# Patient Record
Sex: Female | Born: 1965
Health system: Southern US, Community
[De-identification: ages and names within clinical notes are randomized; demographics above are authoritative.]

## PROBLEM LIST (undated history)

## (undated) DIAGNOSIS — I10 Essential (primary) hypertension: Secondary | ICD-10-CM

## (undated) DIAGNOSIS — E785 Hyperlipidemia, unspecified: Secondary | ICD-10-CM

## (undated) DIAGNOSIS — I2510 Atherosclerotic heart disease of native coronary artery without angina pectoris: Secondary | ICD-10-CM

## (undated) HISTORY — DX: Hyperlipidemia, unspecified: E78.5

## (undated) HISTORY — DX: Essential (primary) hypertension: I10

## (undated) HISTORY — PX: HYSTEROSCOPY: SHX211

## (undated) HISTORY — PX: TUBAL LIGATION: SHX77

## (undated) HISTORY — DX: Atherosclerotic heart disease of native coronary artery without angina pectoris: I25.10

## (undated) HISTORY — PX: PELVIC LAPAROSCOPY: SHX162

## (undated) HISTORY — PX: ELBOW SURGERY: SHX618

---

## 1998-08-09 ENCOUNTER — Other Ambulatory Visit: Admission: RE | Admit: 1998-08-09 | Discharge: 1998-08-09 | Payer: Self-pay | Admitting: Gynecology

## 1999-08-14 ENCOUNTER — Other Ambulatory Visit: Admission: RE | Admit: 1999-08-14 | Discharge: 1999-08-14 | Payer: Self-pay | Admitting: Gynecology

## 2000-09-03 ENCOUNTER — Other Ambulatory Visit: Admission: RE | Admit: 2000-09-03 | Discharge: 2000-09-03 | Payer: Self-pay | Admitting: Gynecology

## 2000-12-01 ENCOUNTER — Other Ambulatory Visit: Admission: RE | Admit: 2000-12-01 | Discharge: 2000-12-01 | Payer: Self-pay | Admitting: Gynecology

## 2002-01-27 ENCOUNTER — Other Ambulatory Visit: Admission: RE | Admit: 2002-01-27 | Discharge: 2002-01-27 | Payer: Self-pay | Admitting: Gynecology

## 2003-05-16 ENCOUNTER — Other Ambulatory Visit: Admission: RE | Admit: 2003-05-16 | Discharge: 2003-05-16 | Payer: Self-pay | Admitting: Gynecology

## 2004-09-20 ENCOUNTER — Other Ambulatory Visit: Admission: RE | Admit: 2004-09-20 | Discharge: 2004-09-20 | Payer: Self-pay | Admitting: Gynecology

## 2006-03-24 ENCOUNTER — Other Ambulatory Visit: Admission: RE | Admit: 2006-03-24 | Discharge: 2006-03-24 | Payer: Self-pay | Admitting: Gynecology

## 2007-05-05 ENCOUNTER — Other Ambulatory Visit: Admission: RE | Admit: 2007-05-05 | Discharge: 2007-05-05 | Payer: Self-pay | Admitting: Gynecology

## 2008-05-05 ENCOUNTER — Other Ambulatory Visit: Admission: RE | Admit: 2008-05-05 | Discharge: 2008-05-05 | Payer: Self-pay | Admitting: Gynecology

## 2008-10-19 ENCOUNTER — Emergency Department (HOSPITAL_BASED_OUTPATIENT_CLINIC_OR_DEPARTMENT_OTHER): Admission: EM | Admit: 2008-10-19 | Discharge: 2008-10-20 | Payer: Self-pay | Admitting: Emergency Medicine

## 2008-12-07 ENCOUNTER — Ambulatory Visit: Payer: Self-pay | Admitting: Gynecology

## 2008-12-14 ENCOUNTER — Ambulatory Visit: Payer: Self-pay | Admitting: Gynecology

## 2009-06-06 ENCOUNTER — Ambulatory Visit: Payer: Self-pay | Admitting: Gynecology

## 2009-09-20 ENCOUNTER — Ambulatory Visit: Payer: Self-pay | Admitting: Gynecology

## 2010-04-10 ENCOUNTER — Other Ambulatory Visit: Admission: RE | Admit: 2010-04-10 | Discharge: 2010-04-10 | Payer: Self-pay | Admitting: Gynecology

## 2010-04-10 ENCOUNTER — Ambulatory Visit: Payer: Self-pay | Admitting: Gynecology

## 2011-01-13 LAB — CBC
MCHC: 33.6 g/dL (ref 30.0–36.0)
MCV: 85.9 fL (ref 78.0–100.0)
Platelets: 244 10*3/uL (ref 150–400)
WBC: 11 10*3/uL — ABNORMAL HIGH (ref 4.0–10.5)

## 2011-01-13 LAB — URINALYSIS, ROUTINE W REFLEX MICROSCOPIC
Glucose, UA: NEGATIVE mg/dL
Ketones, ur: >80 mg/dL — AB
Protein, ur: NEGATIVE mg/dL
Urobilinogen, UA: 0.2 mg/dL (ref 0.0–1.0)

## 2011-01-13 LAB — BASIC METABOLIC PANEL
BUN: 13 mg/dL (ref 6–23)
CO2: 26 mEq/L (ref 19–32)
Calcium: 9.3 mg/dL (ref 8.4–10.5)
Chloride: 102 mEq/L (ref 96–112)
Creatinine, Ser: 0.7 mg/dL (ref 0.4–1.2)
GFR calc Af Amer: >60 mL/min (ref >60)

## 2011-01-13 LAB — DIFFERENTIAL
Basophils Relative: 1 % (ref 0–1)
Eosinophils Absolute: 0 10*3/uL (ref 0.0–0.7)
Neutro Abs: 10.5 10*3/uL — ABNORMAL HIGH (ref 1.7–7.7)
Neutrophils Relative %: 96 % — ABNORMAL HIGH (ref 43–77)

## 2011-05-20 ENCOUNTER — Encounter: Payer: Self-pay | Admitting: Gynecology

## 2011-05-22 ENCOUNTER — Other Ambulatory Visit (HOSPITAL_COMMUNITY)
Admission: RE | Admit: 2011-05-22 | Discharge: 2011-05-22 | Disposition: A | Discharge status: Home / Self Care | Payer: BC Managed Care – PPO | Source: Ambulatory Visit | Attending: Gynecology | Admitting: Gynecology

## 2011-05-22 ENCOUNTER — Ambulatory Visit (INDEPENDENT_AMBULATORY_CARE_PROVIDER_SITE_OTHER): Payer: BC Managed Care – PPO | Admitting: Gynecology

## 2011-05-22 ENCOUNTER — Inpatient Hospital Stay (HOSPITAL_COMMUNITY): Admission: RE | Admit: 2011-05-22 | Payer: Self-pay | Source: Ambulatory Visit

## 2011-05-22 ENCOUNTER — Encounter: Payer: Self-pay | Admitting: Gynecology

## 2011-05-22 VITALS — BP 134/84 | Ht 60.5 in | Wt 127.0 lb

## 2011-05-22 DIAGNOSIS — R823 Hemoglobinuria: Secondary | ICD-10-CM

## 2011-05-22 DIAGNOSIS — G43909 Migraine, unspecified, not intractable, without status migrainosus: Secondary | ICD-10-CM | POA: Insufficient documentation

## 2011-05-22 DIAGNOSIS — Z13220 Encounter for screening for lipoid disorders: Secondary | ICD-10-CM

## 2011-05-22 DIAGNOSIS — R51 Headache: Secondary | ICD-10-CM

## 2011-05-22 DIAGNOSIS — G8929 Other chronic pain: Secondary | ICD-10-CM

## 2011-05-22 DIAGNOSIS — Z01419 Encounter for gynecological examination (general) (routine) without abnormal findings: Secondary | ICD-10-CM | POA: Insufficient documentation

## 2011-05-22 DIAGNOSIS — Z131 Encounter for screening for diabetes mellitus: Secondary | ICD-10-CM

## 2011-05-22 MED ORDER — NARATRIPTAN HCL 2.5 MG PO TABS: 2.5000 mg | ORAL_TABLET | ORAL | Status: DC | PRN

## 2011-05-22 NOTE — Progress Notes (Signed)
Theresa Bass 03-17-1966 [id number]        45 y.o.  for annual exam.  Doing well no complaints, tubal sterilization birth control.  Past medical history,surgical history, allergies, family history and social history were all reviewed and documented in the EPIC chart. ROS:  Was performed and pertinent positives and negatives are included in the history.  Exam: chaperone present Filed Vitals:   05/22/11 1029  BP: 134/84   General appearance  Normal Skin grossly normal Head/Neck normal with no cervical or supraclavicular adenopathy thyroid normal Lungs  clear Cardiac RR, without RMG Abdominal  soft, nontender, without masses, organomegaly or hernia Breasts  examined lying and sitting without masses, retractions, discharge or axillary adenopathy.  Bilateral nipple piercing noted Pelvic  Ext/BUS/vagina  normal   Cervix  normal  Pap done light menses flow noted  Uterus  anteverted, normal size, shape and contour, midline and mobile nontender   Adnexa  Without masses or tenderness    Anus and perineum  normal   Rectovaginal  normal sphincter tone without palpated masses or tenderness.    Assessment/Plan:  45 y.o. female for annual exam.   Doing well. Had nipple piercing this past year. Self breast exams on a monthly basis discussed and urge. Still has not had mammogram. I strongly encouraged her to schedule mammogram and she understands my recommendations.  Menses are regular. Sterilization birth control. Baseline CBC, urinalysis, glucose and lipid profile ordered. If significant he is well from a gynecologic standpoint she'll see me in a year. She does have a history of classic migraines and uses Amerge and I refilled her #10 with 2 refills.    Dara Lords MD, 11:25 AM 05/22/2011

## 2011-05-29 ENCOUNTER — Telehealth: Payer: Self-pay | Admitting: Gynecology

## 2011-05-29 NOTE — Telephone Encounter (Signed)
Tell patient low grade atypia on her Pap smear.  Not enough sample to do HPV testing as recommended and I like her to come back at her convenience to repeat her Pap smear.

## 2011-05-30 NOTE — Telephone Encounter (Signed)
Pt informed with the below note and will make appointment to schedule repeat pap.

## 2011-10-27 ENCOUNTER — Other Ambulatory Visit: Payer: Self-pay | Admitting: *Deleted

## 2011-10-27 DIAGNOSIS — G8929 Other chronic pain: Secondary | ICD-10-CM

## 2011-10-27 MED ORDER — NARATRIPTAN HCL 2.5 MG PO TABS: 2.5000 mg | ORAL_TABLET | ORAL | Status: DC | PRN

## 2011-10-28 NOTE — Telephone Encounter (Signed)
rx called in

## 2012-03-01 ENCOUNTER — Other Ambulatory Visit: Payer: Self-pay | Admitting: *Deleted

## 2012-03-01 DIAGNOSIS — G8929 Other chronic pain: Secondary | ICD-10-CM

## 2012-03-01 MED ORDER — NARATRIPTAN HCL 2.5 MG PO TABS: 2.5000 mg | ORAL_TABLET | ORAL | Status: DC | PRN

## 2012-03-01 NOTE — Telephone Encounter (Signed)
rx called in

## 2012-05-26 ENCOUNTER — Ambulatory Visit (INDEPENDENT_AMBULATORY_CARE_PROVIDER_SITE_OTHER): Payer: BC Managed Care – PPO | Admitting: Gynecology

## 2012-05-26 ENCOUNTER — Other Ambulatory Visit (HOSPITAL_COMMUNITY)
Admission: RE | Admit: 2012-05-26 | Discharge: 2012-05-26 | Disposition: A | Discharge status: Home / Self Care | Payer: BC Managed Care – PPO | Source: Ambulatory Visit | Attending: Gynecology | Admitting: Gynecology

## 2012-05-26 ENCOUNTER — Encounter: Payer: Self-pay | Admitting: Gynecology

## 2012-05-26 VITALS — BP 106/60 | Ht 61.0 in | Wt 125.0 lb

## 2012-05-26 DIAGNOSIS — Z01419 Encounter for gynecological examination (general) (routine) without abnormal findings: Secondary | ICD-10-CM

## 2012-05-26 DIAGNOSIS — R51 Headache: Secondary | ICD-10-CM

## 2012-05-26 DIAGNOSIS — G8929 Other chronic pain: Secondary | ICD-10-CM

## 2012-05-26 DIAGNOSIS — N926 Irregular menstruation, unspecified: Secondary | ICD-10-CM

## 2012-05-26 DIAGNOSIS — N951 Menopausal and female climacteric states: Secondary | ICD-10-CM

## 2012-05-26 LAB — CBC WITH DIFFERENTIAL/PLATELET
Hemoglobin: 13.9 g/dL (ref 12.0–15.0)
Lymphocytes Relative: 32 % (ref 12–46)
Lymphs Abs: 1.2 10*3/uL (ref 0.7–4.0)
Neutro Abs: 2.1 10*3/uL (ref 1.7–7.7)
Neutrophils Relative %: 55 % (ref 43–77)
Platelets: 255 10*3/uL (ref 150–400)
RBC: 4.69 MIL/uL (ref 3.87–5.11)
WBC: 3.7 10*3/uL — ABNORMAL LOW (ref 4.0–10.5)

## 2012-05-26 MED ORDER — NARATRIPTAN HCL 2.5 MG PO TABS: 2.5000 mg | ORAL_TABLET | ORAL | Status: DC | PRN

## 2012-05-26 NOTE — Patient Instructions (Signed)
Call to Schedule your mammogram  Facilities in [place]: 1)  [facility], [address] Phone: [phone] 2)  [facility], [address] Phone: [phone] 3)  Dr. Bertrand at [facility]  [address] Phone: [phone]     Mammogram A mammogram is an X-ray test to find changes in a woman's breast. You should get a mammogram if:  You are 40 years of age or older  You have risk factors.   Your doctor recommends that you have one.  BEFORE THE TEST  Do not schedule the test the week before your period, especially if your breasts are sore during this time.  On the day of your mammogram:  Wash your breasts and armpits well. After washing, do not put on any deodorant or talcum powder on until after your test.   Eat and drink as you usually do.   Take your medicines as usual.   If you are diabetic and take insulin, make sure you:   Eat before coming for your test.   Take your insulin as usual.   If you cannot keep your appointment, call before the appointment to cancel. Schedule another appointment.  TEST  You will need to undress from the waist up. You will put on a hospital gown.   Your breast will be put on the mammogram machine, and it will press firmly on your breast with a piece of plastic called a compression paddle. This will make your breast flatter so that the machine can X-ray all parts of your breast.   Both breasts will be X-rayed. Each breast will be X-rayed from above and from the side. An X-ray might need to be taken again if the picture is not good enough.   The mammogram will last about 15 to 30 minutes.  AFTER THE TEST Finding out the results of your test Ask when your test results will be ready. Make sure you get your test results.  Document Released: 12/12/2008 Document Revised: 09/04/2011 Document Reviewed: 12/12/2008 [employer] Patient  Information 2012 [employer].  

## 2012-05-26 NOTE — Progress Notes (Signed)
Theresa Bass [birth year] [id number]        46 y.o.  G5P0014 for annual exam.  Doing well. Several issues noted below  Past medical history,surgical history, medications, allergies, family history and social history were all reviewed and documented in the EPIC chart. ROS:  Was performed and pertinent positives and negatives are included in the history.  Exam: [name] assistant Filed Vitals:   05/26/12 0937  BP: 106/60  Height: 5\' 1"  (1.549 m)  Weight: 125 lb (56.7 kg)   General appearance  Normal Skin grossly normal Head/Neck normal with no cervical or supraclavicular adenopathy thyroid normal Lungs  clear Cardiac RR, without RMG Abdominal  soft, nontender, without masses, organomegaly or hernia Breasts  examined lying and sitting without masses, retractions, discharge or axillary adenopathy.  Bilateral nipple piercing. Pelvic  Ext/BUS/vagina  normal   Cervix  normal Pap/HPV  Uterus  anteverted, normal size, shape and contour, midline and mobile nontender   Adnexa  Without masses or tenderness    Anus and perineum  normal   Rectovaginal  normal sphincter tone without palpated masses or tenderness.    Assessment/Plan:  46 y.o. [id number] female for annual exam, status post BTL.   1. Irregular menses/menopausal symptoms. Patient's periods are getting more irregular this past year which she will skip one or 2 months. No prolonged or intermenstrual bleeding. Also having hot flushes and night sweats with some insomnia although this seems to be improving. It was worse during the summer. No other symptoms such as hair skin weight changes. Check FSH TSH.  Options for management include observation, low-dose HRT, low-dose oral contraceptives reviewed. The issues of her irregular menses also discussed. With her history of migraines I am reluctant with the oral contraceptives. Possible low-dose patch alone with intermittent progesterone withdrawal symptoms more than 2 months without menses discussed.  Patient does not want intervention but prefers observation at this time. OTC soy-based options reviewed.  If FSH elevated and will keep menstrual calendar as long as less frequent but normal menses will follow. Prolonged her typical bleeding will call or she does more than one year without bleeding and then bleeds. If FSH normal then recommend progesterone withdrawal if she goes more than 2 months without menses and she knows to call. 2. Migraine headaches. Patient's well-controlled on Amerge and I refilled her #10 with 5 refills. 3. Mammography. Patient's way overdue and knows to schedule this and agrees to do so. SBE monthly reviewed. 4. Pap smear. Patient has history of ASCUS last year. Insufficient for HPV. Was to follow up for repeat but never did. Pap/HPV done this year. No history of abnormal Paps previously with numerous normal reports in her chart. We'll triage based upon results. 5. Health maintenance. CBC glucose lipid profile were normal last year.  Cholesterol 205, HDL 74 LDL 107. We'll not repeat this year. Follow up one year, sooner as needed.    Dara Lords MD, 10:11 AM 05/26/2012

## 2012-05-27 ENCOUNTER — Encounter: Payer: Self-pay | Admitting: Gynecology

## 2012-05-27 LAB — URINALYSIS W MICROSCOPIC + REFLEX CULTURE
Bacteria, UA: NONE SEEN
Casts: NONE SEEN
Crystals: NONE SEEN
Glucose, UA: NEGATIVE mg/dL
Hgb urine dipstick: NEGATIVE
Ketones, ur: NEGATIVE mg/dL
Specific Gravity, Urine: 1.01 (ref 1.005–1.030)
pH: 7 (ref 5.0–8.0)

## 2012-08-13 ENCOUNTER — Telehealth: Payer: Self-pay | Admitting: *Deleted

## 2012-08-13 MED ORDER — MEDROXYPROGESTERONE ACETATE 10 MG PO TABS: 10.0000 mg | ORAL_TABLET | Freq: Two times a day (BID) | ORAL | Status: DC

## 2012-08-13 NOTE — Telephone Encounter (Signed)
For the bleeding right now I would take Provera 10 mg twice daily for 5 days.  I would then recommend office visit to discuss longer-term game plan.

## 2012-08-13 NOTE — Telephone Encounter (Signed)
Pt calling to follow up with irregular bleeding, pt said Oct and Nov she would bleed and pass big clots follow with cramping, heavy bleeding. Pt said this month has been the worse change pad every hour with extreme cramps. Last bleeding cycle was this pass Sunday and still bleeding now. Please advise

## 2012-08-13 NOTE — Telephone Encounter (Signed)
Pt informed with the below note. She will make appointment to discuss long term care.

## 2012-08-24 ENCOUNTER — Encounter: Payer: Self-pay | Admitting: Gynecology

## 2012-08-24 ENCOUNTER — Ambulatory Visit (INDEPENDENT_AMBULATORY_CARE_PROVIDER_SITE_OTHER): Payer: BC Managed Care – PPO | Admitting: Gynecology

## 2012-08-24 DIAGNOSIS — N920 Excessive and frequent menstruation with regular cycle: Secondary | ICD-10-CM

## 2012-08-24 DIAGNOSIS — N926 Irregular menstruation, unspecified: Secondary | ICD-10-CM

## 2012-08-24 DIAGNOSIS — N951 Menopausal and female climacteric states: Secondary | ICD-10-CM

## 2012-08-24 MED ORDER — MEGESTROL ACETATE 20 MG PO TABS: 20.0000 mg | ORAL_TABLET | Freq: Every day | ORAL | Status: DC

## 2012-08-24 MED ORDER — ESTRADIOL 0.1 MG/24HR TD PTTW: 1.0000 | MEDICATED_PATCH | TRANSDERMAL | Status: DC

## 2012-08-24 MED ORDER — PROGESTERONE MICRONIZED 100 MG PO CAPS: 100.0000 mg | ORAL_CAPSULE | Freq: Every day | ORAL | Status: DC

## 2012-08-24 NOTE — Progress Notes (Signed)
Patient presents having recently been evaluated at her annual in August doing well with mildly elevated FSH of 33. Notes over the last several months heavier menses and now bleeding continuously on and off for 3 weeks. She was treated over the phone with Provera last week which slowed her bleeding but she still was staining. Also with hot flushes and sweats which are intolerable.  Exam was [name] Asst. Abdomen soft nontender without masses guarding rebound organomegaly. Pelvic external BUS vagina with slight staining. Cervix normal. Uterus anteverted normal size midline mobile nontender. Adnexa without masses or tenderness  Assessment and plan: Perimenopausal with most recent heavier menses and irregular bleeding. Start with sonohysterogram rule out lump palpable abnormality such as submucous myomas/polyps/hyperplasia. Options for menopausal symptom management reviewed to include observation/OTC soy based/HRT. Reviewed in detail the risks benefits of HRT to include the WHI study with increased risk of stroke heart attack DVT and breast cancer. She does have menstrual migraines and the issue of HRT and increased risk of possible stroke discussed.  She understands is a perimenopausal patient she will still have irregular undulations in that HRT will not override this. Various regimens to include transdermal/oral first pass effect benefits discussed. Intermittent progesterone versus continuous reviewed. After lengthy discussion we will plan on Megace 20 mg daily for 7 days to hopefully thin endometrium and then continue Prometrium 100 mg nightly. We'll start with MiniVivelle 0.1 mg patch is now and one month sample given with refill. Patient will follow up for her sonohysterogram and will assess her response to the HRT.

## 2012-08-24 NOTE — Patient Instructions (Signed)
Take Megace daily for 7 days then Prometrium nightly every night. Start estrogen patches every 4 days now. Follow up for ultrasound. Call if any questions whatsoever.

## 2012-09-06 ENCOUNTER — Ambulatory Visit (INDEPENDENT_AMBULATORY_CARE_PROVIDER_SITE_OTHER): Payer: BC Managed Care – PPO

## 2012-09-06 ENCOUNTER — Encounter: Payer: Self-pay | Admitting: Gynecology

## 2012-09-06 ENCOUNTER — Telehealth: Payer: Self-pay | Admitting: *Deleted

## 2012-09-06 ENCOUNTER — Ambulatory Visit (INDEPENDENT_AMBULATORY_CARE_PROVIDER_SITE_OTHER): Payer: BC Managed Care – PPO | Admitting: Gynecology

## 2012-09-06 DIAGNOSIS — N926 Irregular menstruation, unspecified: Secondary | ICD-10-CM

## 2012-09-06 DIAGNOSIS — N8300 Follicular cyst of ovary, unspecified side: Secondary | ICD-10-CM

## 2012-09-06 DIAGNOSIS — N920 Excessive and frequent menstruation with regular cycle: Secondary | ICD-10-CM

## 2012-09-06 DIAGNOSIS — Z820 Family history of epilepsy and other diseases of the nervous system: Secondary | ICD-10-CM

## 2012-09-06 DIAGNOSIS — N951 Menopausal and female climacteric states: Secondary | ICD-10-CM

## 2012-09-06 NOTE — Telephone Encounter (Signed)
Appointment on Sep 30 2012 @ 10:15 with Dr.Lewitt. Notes faxed. Pt informed.

## 2012-09-06 NOTE — Telephone Encounter (Signed)
Message copied by Aura Camps on Mon Sep 06, 2012 12:34 PM ------      Message from: Dara Lords      Created: Mon Sep 06, 2012 11:42 AM       Arrange appointment with Dr. Karenann Cai in reference to migraine headaches.

## 2012-09-06 NOTE — Progress Notes (Signed)
Patient presents for sonohysterogram with history of irregular bleeding. She was having regular monthly menses and then she started bleeding on and off for one month. Also having hot flashes and night sweats which were intolerable. I started her on miniVivelle 0.1 mg and notes that her hot flashes are better.  She was also given Megace 20 mg initially to stop her bleeding and now is on Prometrium 100 mg nightly.  She still is having a fair amount of migraine headaches.  Ultrasound shows uterus generous in size with homogeneous myometrium. Initial echo 14 mm. Right and left ovaries visualized and normal with physiologic changes. Cul-de-sac negative. Senna histogram performed, sterile technique, easy catheter introduction, good distention with no abnormalities. An endometrial sample taken. Patient tolerated well.  Assessment and plan: 1. Menopause. Patient doing well on HRT wants to continue. Will continue on MiniVivelle 0.1 mg and Prometrium 100 mg. Keep menstrual calendar. If she does any irregular bleeding she is to follow up with me. 2. Migraine headache. Recommend neurology evaluation. She was seen by a neurologist a number of years ago I think given the recent flare she needs to be seen now we'll have her make an appointment to see Dr. Karenann Cai.

## 2012-09-06 NOTE — Patient Instructions (Signed)
Office will call you with biopsy results. Continue on hormone replacement. Report any unusual bleeding. Office will contact you to arrange neurology appointment in follow up of your migraine headaches.

## 2012-11-30 ENCOUNTER — Encounter: Payer: Self-pay | Admitting: Gynecology

## 2013-02-17 ENCOUNTER — Other Ambulatory Visit: Payer: Self-pay

## 2013-02-17 ENCOUNTER — Other Ambulatory Visit: Payer: Self-pay | Admitting: *Deleted

## 2013-02-17 DIAGNOSIS — G8929 Other chronic pain: Secondary | ICD-10-CM

## 2013-02-17 MED ORDER — NARATRIPTAN HCL 2.5 MG PO TABS: 2.5000 mg | ORAL_TABLET | ORAL | Status: DC | PRN

## 2013-02-18 MED ORDER — NARATRIPTAN HCL 2.5 MG PO TABS: 2.5000 mg | ORAL_TABLET | ORAL | Status: DC | PRN

## 2013-02-18 NOTE — Telephone Encounter (Signed)
Phoned in to pharmacy. 

## 2013-03-07 ENCOUNTER — Encounter: Payer: Self-pay | Admitting: Gynecology

## 2013-03-07 ENCOUNTER — Ambulatory Visit (INDEPENDENT_AMBULATORY_CARE_PROVIDER_SITE_OTHER): Payer: BC Managed Care – PPO | Admitting: Gynecology

## 2013-03-07 DIAGNOSIS — N899 Noninflammatory disorder of vagina, unspecified: Secondary | ICD-10-CM

## 2013-03-07 DIAGNOSIS — N898 Other specified noninflammatory disorders of vagina: Secondary | ICD-10-CM

## 2013-03-07 DIAGNOSIS — N926 Irregular menstruation, unspecified: Secondary | ICD-10-CM

## 2013-03-07 MED ORDER — BETAMETHASONE DIPROPIONATE AUG 0.05 % EX CREA: TOPICAL_CREAM | Freq: Two times a day (BID) | CUTANEOUS | Status: DC

## 2013-03-07 MED ORDER — MEDROXYPROGESTERONE ACETATE 10 MG PO TABS: 10.0000 mg | ORAL_TABLET | Freq: Every day | ORAL | Status: DC

## 2013-03-07 MED ORDER — FLUCONAZOLE 200 MG PO TABS: 200.0000 mg | ORAL_TABLET | Freq: Every day | ORAL | Status: DC

## 2013-03-07 NOTE — Progress Notes (Signed)
Patient presents with continued spotting on and off on HRT. She's on Minivelle 0.1 mg and Prometrium 100 mg nightly. Had undergone a negative sonohysterogram in December with endometrial sampling showing secretory endometrium. FSH is 33. Hot flushes sweats and other hormonal symptoms are better. Is having a lot of vulvar irritation from wearing pads.  Exam with Kim assistant Abdomen soft nontender without masses guarding rebound organomegaly. Pelvic external BUS vagina normal. Cervix normal. Uterus normal size midline mobile nontender. Adnexa without masses or tenderness.  Assessment and plan: Perimenopausal irregular bleeding. On HRT with good results. I think she's having escaped ovulations not controlled by the HRT accounting for her bleeding. Options for management to include low-dose oral contraceptives, oral continuous such as Activella, increasing to just around to 200 mg nightly, intermittent progesterone withdrawals monthly such as Provera for the first 12 days each month reviewed. Patient does have a history of migraines and does not want to consider oral contraceptives. She never did well with them before and does not want the issue of stroke risk. After lengthy discussion she is going to stay on Minivelle and do the first 12 days of each month with Provera for a more consistent withdrawal bleed to hopefully eliminate the intermenstrual staining. She is going to the beach this coming week so will start to 12 days now which hopefully will suppress her bleeding and then restart the first 12 days of August. I am going to cover her vulvar irritation with Diflucan 200 mg x1 dose and Diprolene 0.05% cream daily to help suppress irritation. I doubt atrophic vaginitis given the short term perimenopausal situation and the use of systemic HRT. If we continue chronically then we'll consider vaginal estrogen supplementation. Followup as needed.

## 2013-03-07 NOTE — Patient Instructions (Signed)
Call if you rayed her bleeding continues. Otherwise followup when due for your annual exam.

## 2013-05-16 ENCOUNTER — Other Ambulatory Visit: Payer: Self-pay

## 2013-05-16 MED ORDER — ESTRADIOL 0.1 MG/24HR TD PTTW: 1.0000 | MEDICATED_PATCH | TRANSDERMAL | Status: DC

## 2013-07-22 ENCOUNTER — Other Ambulatory Visit: Payer: Self-pay | Admitting: *Deleted

## 2013-07-22 MED ORDER — ESTRADIOL 0.1 MG/24HR TD PTTW: 1.0000 | MEDICATED_PATCH | TRANSDERMAL | Status: DC

## 2013-07-22 MED ORDER — PROGESTERONE MICRONIZED 100 MG PO CAPS: 100.0000 mg | ORAL_CAPSULE | Freq: Every day | ORAL | Status: DC

## 2013-07-22 NOTE — Telephone Encounter (Signed)
Will be called to schedule annual exam KW

## 2013-08-23 ENCOUNTER — Other Ambulatory Visit: Payer: Self-pay | Admitting: Gynecology

## 2013-08-26 ENCOUNTER — Other Ambulatory Visit: Payer: Self-pay | Admitting: Gynecology

## 2013-08-31 ENCOUNTER — Telehealth: Payer: Self-pay | Admitting: *Deleted

## 2013-08-31 NOTE — Telephone Encounter (Signed)
Pharmacy faxed over prior authorization for Amerge 2.5 mg tablets. Form filled out and faxed back to [facility]. Will wait for response.

## 2013-09-02 NOTE — Telephone Encounter (Signed)
[employer] faxed response back stating no prior authorization is needed for the below medication.

## 2013-09-05 ENCOUNTER — Encounter: Payer: Self-pay | Admitting: Gynecology

## 2013-09-05 ENCOUNTER — Ambulatory Visit (INDEPENDENT_AMBULATORY_CARE_PROVIDER_SITE_OTHER): Payer: BC Managed Care – PPO | Admitting: Gynecology

## 2013-09-05 VITALS — BP 112/76 | Ht 62.0 in | Wt 134.0 lb

## 2013-09-05 DIAGNOSIS — G43909 Migraine, unspecified, not intractable, without status migrainosus: Secondary | ICD-10-CM

## 2013-09-05 DIAGNOSIS — N926 Irregular menstruation, unspecified: Secondary | ICD-10-CM

## 2013-09-05 DIAGNOSIS — Z79890 Hormone replacement therapy (postmenopausal): Secondary | ICD-10-CM

## 2013-09-05 DIAGNOSIS — Z01419 Encounter for gynecological examination (general) (routine) without abnormal findings: Secondary | ICD-10-CM

## 2013-09-05 LAB — URINALYSIS W MICROSCOPIC + REFLEX CULTURE
Glucose, UA: NEGATIVE mg/dL
Hgb urine dipstick: NEGATIVE
Leukocytes, UA: NEGATIVE
Nitrite: NEGATIVE
Protein, ur: NEGATIVE mg/dL
Urobilinogen, UA: 0.2 mg/dL (ref 0.0–1.0)

## 2013-09-05 LAB — CBC WITH DIFFERENTIAL/PLATELET
Basophils Absolute: 0 10*3/uL (ref 0.0–0.1)
Basophils Relative: 1 % (ref 0–1)
Eosinophils Relative: 5 % (ref 0–5)
HCT: 38.4 % (ref 36.0–46.0)
Lymphocytes Relative: 35 % (ref 12–46)
MCHC: 34.6 g/dL (ref 30.0–36.0)
MCV: 83.5 fL (ref 78.0–100.0)
Monocytes Absolute: 0.4 10*3/uL (ref 0.1–1.0)
Platelets: 232 10*3/uL (ref 150–400)
RDW: 13.3 % (ref 11.5–15.5)
WBC: 3.5 10*3/uL — ABNORMAL LOW (ref 4.0–10.5)

## 2013-09-05 LAB — COMPREHENSIVE METABOLIC PANEL
ALT: 19 U/L (ref 0–35)
AST: 22 U/L (ref 0–37)
Alkaline Phosphatase: 62 U/L (ref 39–117)
BUN: 11 mg/dL (ref 6–23)
Chloride: 102 mEq/L (ref 96–112)
Creat: 0.78 mg/dL (ref 0.50–1.10)
Total Bilirubin: 0.3 mg/dL (ref 0.3–1.2)

## 2013-09-05 LAB — LIPID PANEL
HDL: 72 mg/dL (ref >39)
LDL Cholesterol: 90 mg/dL (ref 0–99)
Total CHOL/HDL Ratio: 2.6 Ratio
VLDL: 27 mg/dL (ref 0–40)

## 2013-09-05 LAB — FOLLICLE STIMULATING HORMONE: FSH: 36.3 m[IU]/mL

## 2013-09-05 MED ORDER — PROGESTERONE MICRONIZED 200 MG PO CAPS: 200.0000 mg | ORAL_CAPSULE | Freq: Every day | ORAL | Status: DC

## 2013-09-05 MED ORDER — ESTRADIOL 0.1 MG/24HR TD PTTW: 1.0000 | MEDICATED_PATCH | TRANSDERMAL | Status: DC

## 2013-09-05 MED ORDER — NARATRIPTAN HCL 2.5 MG PO TABS: ORAL_TABLET | ORAL | Status: DC

## 2013-09-05 NOTE — Progress Notes (Signed)
Jerrilynn Mikowski Aug 19, 1966 [id number]        47 y.o.  G5P0014 for annual exam.  Several issues below.  Past medical history,surgical history, problem list, medications, allergies, family history and social history were all reviewed and documented in the EPIC chart.  ROS:  Performed and pertinent positives and negatives are included in the history, assessment and plan .  Exam: Kim assistant Filed Vitals:   09/05/13 0903  BP: 112/76  Height: 5\' 2"  (1.575 m)  Weight: 134 lb (60.782 kg)   General appearance  Normal Skin grossly normal Head/Neck normal with no cervical or supraclavicular adenopathy thyroid normal Lungs  clear Cardiac RR, without RMG Abdominal  soft, nontender, without masses, organomegaly or hernia Breasts  examined lying and sitting without masses, retractions, discharge or axillary adenopathy. Bilateral nipple piercings noted. Pelvic  Ext/BUS/vagina  Normal   Cervix  Normal   Uterus  anteverted, normal size, shape and contour, midline and mobile nontender   Adnexa  Without masses or tenderness    Anus and perineum  Normal   Rectovaginal  Normal sphincter tone without palpated masses or tenderness.    Assessment/Plan:  47 y.o. G5P0014 female for annual exam.   1. Perimenopausal/HRT. Patient's on Minivelle 0.1 mg patch and Prometrium 100 mg nightly. She does continue to have menses usually monthly but sometimes twice monthly. Underwent workup last year 08/2012 with negative sonohysterogram and endometrial biopsy showing secretory endometrium. She has received great relief of her hot flushes sweats with the Minivelle. Recommended she continue for now switch to Prometrium 200 mg versed 12 days of each month to try to regulate her withdrawal bleed. FSH last year was 33 we'll recheck now. Patient will call me if irregular bleeding continues after switching to the monthly Prometrium withdrawal. Again reviewed the risks of HRT including stroke heart attack DVT breast cancer  particularly with her migraine history. 2. Migraines. Patient uses Amerge with good success. Usually has one migraine monthly to every other month. I refilled her Amerge. Offered referral to neurologist but she declines at this time. 3. Mammography never. I again emphatically stressed the need to schedule her baseline mammogram. Benefits of early detection reviewed. Patient agrees to schedule. SBE monthly reviewed. 4. Pap smear 2013. No Pap smear done today. History of ASCUS, insufficient to do HPV 2012. Pap smear 2013 negative. I thought HPV was done but apparently not on 2013 Pap smear. Will have patient return now to do Pap smear with HPV. 5. Health maintenance. Baseline CBC comprehensive metabolic panel lipid profile urinalysis FSH ordered. Follow up for Pap smear otherwise annually.   Note: This document was prepared with digital dictation and possible smart phrase technology. Any transcriptional errors that result from this process are unintentional.   Dara Lords MD, 9:54 AM 09/05/2013

## 2013-09-05 NOTE — Patient Instructions (Signed)
Followup for Pap smear that we did not do at your annual exam.  Call to Schedule your mammogram  Facilities in [place]: 1)  [facility] [place], [place] Phone: [phone] 2)  [facility]. [facility], [phone] Phone: [phone] 3)  Dr. Yolanda Bonine at [facility] [address] Phone: [phone]     Mammogram A mammogram is an X-ray test to find changes in a woman's breast. You should get a mammogram if:  You are 48 years of age or older  You have risk factors.   Your doctor recommends that you have one.  BEFORE THE TEST  Do not schedule the test the week before your period, especially if your breasts are sore during this time.  On the day of your mammogram:  Wash your breasts and armpits well. After washing, do not put on any deodorant or talcum powder on until after your test.   Eat and drink as you usually do.   Take your medicines as usual.   If you are diabetic and take insulin, make sure you:   Eat before coming for your test.   Take your insulin as usual.   If you cannot keep your appointment, call before the appointment to cancel. Schedule another appointment.  TEST  You will need to undress from the waist up. You will put on a hospital gown.   Your breast will be put on the mammogram machine, and it will press firmly on your breast with a piece of plastic called a compression paddle. This will make your breast flatter so that the machine can X-ray all parts of your breast.   Both breasts will be X-rayed. Each breast will be X-rayed from above and from the side. An X-ray might need to be taken again if the picture is not good enough.   The mammogram will last about 15 to 30 minutes.  AFTER THE TEST Finding out the results of your test Ask when your test results will be ready. Make sure you get your test results.  Document Released: 12/12/2008 Document  Revised: 09/04/2011 Document Reviewed: 12/12/2008 [facility] Patient Information 2012 [place], [place].

## 2013-09-06 ENCOUNTER — Telehealth: Payer: Self-pay

## 2013-09-06 ENCOUNTER — Telehealth: Payer: Self-pay | Admitting: *Deleted

## 2013-09-06 MED ORDER — PROGESTERONE MICRONIZED 200 MG PO CAPS: ORAL_CAPSULE | ORAL | Status: DC

## 2013-09-06 NOTE — Telephone Encounter (Signed)
It is supposed to be Prometrium 200 mg first 12 days each month #12 refill x11

## 2013-09-06 NOTE — Telephone Encounter (Signed)
Pharmacy notified. Rx corrected and escribed to pharmacy.

## 2013-09-06 NOTE — Telephone Encounter (Signed)
Pharmacy called to check Rx.  You ordered Prometrium 200 #12 but s: one po daily.    Your office note says Prometrium 100mg  daily but you ordered Prometrium 200mg .  Please confirm.

## 2013-09-06 NOTE — Telephone Encounter (Signed)
Message copied by Aura Camps on Tue Sep 06, 2013  8:42 AM ------      Message from: Dara Lords      Created: Mon Sep 05, 2013 10:00 AM       Tell patient in review of her chart she had the mild atypical Pap smear 2012. I thought that we did HPV with the 2013 Pap smear but on review we did not. I would like to do a Pap smear with HPV this year and unfortunately I did not do it at her annual exam. Have her come back for a quick Pap smear at a no charge and make sure that we do not collect any co-pay. ------

## 2013-09-06 NOTE — Telephone Encounter (Signed)
Pt informed with the below note. 

## 2013-09-07 ENCOUNTER — Ambulatory Visit (INDEPENDENT_AMBULATORY_CARE_PROVIDER_SITE_OTHER): Payer: BC Managed Care – PPO | Admitting: Gynecology

## 2013-09-07 ENCOUNTER — Other Ambulatory Visit (HOSPITAL_COMMUNITY)
Admission: RE | Admit: 2013-09-07 | Discharge: 2013-09-07 | Disposition: A | Discharge status: Home / Self Care | Payer: BC Managed Care – PPO | Source: Ambulatory Visit | Attending: Gynecology | Admitting: Gynecology

## 2013-09-07 ENCOUNTER — Encounter: Payer: Self-pay | Admitting: Gynecology

## 2013-09-07 DIAGNOSIS — R87610 Atypical squamous cells of undetermined significance on cytologic smear of cervix (ASC-US): Secondary | ICD-10-CM

## 2013-09-07 DIAGNOSIS — Z124 Encounter for screening for malignant neoplasm of cervix: Secondary | ICD-10-CM | POA: Insufficient documentation

## 2013-09-07 DIAGNOSIS — Z1151 Encounter for screening for human papillomavirus (HPV): Secondary | ICD-10-CM | POA: Insufficient documentation

## 2013-09-07 NOTE — Patient Instructions (Signed)
Follow up for annual exam in one year 

## 2013-09-07 NOTE — Progress Notes (Signed)
The patient presents for Pap smear. Was just seen 09/05/2013. Pap smear was not done at that time but after she left the office review of her records showed ASCUS not enough specimen for HPV screen 2012. Pap smear 2013 normal but no HPV done. Patient was asked to return to do a Pap smear with HPV for completeness.  Exam was Administrator, [employer] vagina normal. Cervix normal Pap smear done.  Assessment and plan: Patient returns for Pap smear. Assuming negative in annual followup. Otherwise triage based on results.

## 2013-09-07 NOTE — Addendum Note (Signed)
Addended by: Dayna Barker on: 09/07/2013 10:12 AM   Modules accepted: Orders

## 2014-03-10 ENCOUNTER — Other Ambulatory Visit: Payer: Self-pay | Admitting: Gynecology

## 2014-07-14 ENCOUNTER — Other Ambulatory Visit: Payer: Self-pay | Admitting: Gynecology

## 2014-07-31 ENCOUNTER — Encounter: Payer: Self-pay | Admitting: Gynecology

## 2014-09-14 ENCOUNTER — Encounter: Payer: Self-pay | Admitting: Gynecology

## 2014-09-14 ENCOUNTER — Ambulatory Visit (INDEPENDENT_AMBULATORY_CARE_PROVIDER_SITE_OTHER): Payer: BC Managed Care – PPO | Admitting: Gynecology

## 2014-09-14 VITALS — BP 122/82 | Ht 61.0 in | Wt 135.0 lb

## 2014-09-14 DIAGNOSIS — Z01419 Encounter for gynecological examination (general) (routine) without abnormal findings: Secondary | ICD-10-CM

## 2014-09-14 DIAGNOSIS — Z79890 Hormone replacement therapy (postmenopausal): Secondary | ICD-10-CM

## 2014-09-14 DIAGNOSIS — Z1159 Encounter for screening for other viral diseases: Secondary | ICD-10-CM

## 2014-09-14 LAB — URINALYSIS W MICROSCOPIC + REFLEX CULTURE
Bilirubin Urine: NEGATIVE
Casts: NONE SEEN
Crystals: NONE SEEN
Glucose, UA: NEGATIVE mg/dL
Ketones, ur: NEGATIVE mg/dL
Leukocytes, UA: NEGATIVE
NITRITE: NEGATIVE
PROTEIN: NEGATIVE mg/dL
Specific Gravity, Urine: 1.021 (ref 1.005–1.030)
Squamous Epithelial / LPF: NONE SEEN
Urobilinogen, UA: 0.2 mg/dL (ref 0.0–1.0)
pH: 6 (ref 5.0–8.0)

## 2014-09-14 MED ORDER — PROGESTERONE MICRONIZED 100 MG PO CAPS: 100.0000 mg | ORAL_CAPSULE | Freq: Every day | ORAL | Status: DC

## 2014-09-14 MED ORDER — NARATRIPTAN HCL 2.5 MG PO TABS: ORAL_TABLET | ORAL | Status: DC

## 2014-09-14 MED ORDER — ESTRADIOL 0.1 MG/24HR TD PTTW: 1.0000 | MEDICATED_PATCH | TRANSDERMAL | Status: DC

## 2014-09-14 NOTE — Patient Instructions (Signed)
You may obtain a copy of any labs that were done today by logging onto MyChart as outlined in the instructions provided with your AVS (after visit summary). The office will not call with normal lab results but certainly if there are any significant abnormalities then we will contact you.   Health Maintenance, Female A healthy lifestyle and preventative care can promote health and wellness.  Maintain regular health, dental, and eye exams.  Eat a healthy diet. Foods like vegetables, fruits, whole grains, low-fat dairy products, and lean protein foods contain the nutrients you need without too many calories. Decrease your intake of foods high in solid fats, added sugars, and salt. Get information about a proper diet from your caregiver, if necessary.  Regular physical exercise is one of the most important things you can do for your health. Most adults should get at least 150 minutes of moderate-intensity exercise (any activity that increases your heart rate and causes you to sweat) each week. In addition, most adults need muscle-strengthening exercises on 2 or more days a week.   Maintain a healthy weight. The body mass index (BMI) is a screening tool to identify possible weight problems. It provides an estimate of body fat based on height and weight. Your caregiver can help determine your BMI, and can help you achieve or maintain a healthy weight. For adults 20 years and older:  A BMI below 18.5 is considered underweight.  A BMI of 18.5 to 24.9 is normal.  A BMI of 25 to 29.9 is considered overweight.  A BMI of 30 and above is considered obese.  Maintain normal blood lipids and cholesterol by exercising and minimizing your intake of saturated fat. Eat a balanced diet with plenty of fruits and vegetables. Blood tests for lipids and cholesterol should begin at age 61 and be repeated every 5 years. If your lipid or cholesterol levels are high, you are over 50, or you are a high risk for heart  disease, you may need your cholesterol levels checked more frequently.Ongoing high lipid and cholesterol levels should be treated with medicines if diet and exercise are not effective.  If you smoke, find out from your caregiver how to quit. If you do not use tobacco, do not start.  Lung cancer screening is recommended for adults aged 33 80 years who are at high risk for developing lung cancer because of a history of smoking. Yearly low-dose computed tomography (CT) is recommended for people who have at least a 30-pack-year history of smoking and are a current smoker or have quit within the past 15 years. A pack year of smoking is smoking an average of 1 pack of cigarettes a day for 1 year (for example: 1 pack a day for 30 years or 2 packs a day for 15 years). Yearly screening should continue until the smoker has stopped smoking for at least 15 years. Yearly screening should also be stopped for people who develop a health problem that would prevent them from having lung cancer treatment.  If you are pregnant, do not drink alcohol. If you are breastfeeding, be very cautious about drinking alcohol. If you are not pregnant and choose to drink alcohol, do not exceed 1 drink per day. One drink is considered to be 12 ounces (355 mL) of beer, 5 ounces (148 mL) of wine, or 1.5 ounces (44 mL) of liquor.  Avoid use of street drugs. Do not share needles with anyone. Ask for help if you need support or instructions about stopping  the use of drugs.  High blood pressure causes heart disease and increases the risk of stroke. Blood pressure should be checked at least every 1 to 2 years. Ongoing high blood pressure should be treated with medicines, if weight loss and exercise are not effective.  If you are 59 to 50 years old, ask your caregiver if you should take aspirin to prevent strokes.  Diabetes screening involves taking a blood sample to check your fasting blood sugar level. This should be done once every 3  years, after age 91, if you are within normal weight and without risk factors for diabetes. Testing should be considered at a younger age or be carried out more frequently if you are overweight and have at least 1 risk factor for diabetes.  Breast cancer screening is essential preventative care for women. You should practice "breast self-awareness." This means understanding the normal appearance and feel of your breasts and may include breast self-examination. Any changes detected, no matter how small, should be reported to a caregiver. Women in their 66s and 30s should have a clinical breast exam (CBE) by a caregiver as part of a regular health exam every 1 to 3 years. After age 101, women should have a CBE every year. Starting at age 100, women should consider having a mammogram (breast X-ray) every year. Women who have a family history of breast cancer should talk to their caregiver about genetic screening. Women at a high risk of breast cancer should talk to their caregiver about having an MRI and a mammogram every year.  Breast cancer gene (BRCA)-related cancer risk assessment is recommended for women who have family members with BRCA-related cancers. BRCA-related cancers include breast, ovarian, tubal, and peritoneal cancers. Having family members with these cancers may be associated with an increased risk for harmful changes (mutations) in the breast cancer genes BRCA1 and BRCA2. Results of the assessment will determine the need for genetic counseling and BRCA1 and BRCA2 testing.  The Pap test is a screening test for cervical cancer. Women should have a Pap test starting at age 57. Between ages 25 and 35, Pap tests should be repeated every 2 years. Beginning at age 37, you should have a Pap test every 3 years as long as the past 3 Pap tests have been normal. If you had a hysterectomy for a problem that was not cancer or a condition that could lead to cancer, then you no longer need Pap tests. If you are  between ages 50 and 76, and you have had normal Pap tests going back 10 years, you no longer need Pap tests. If you have had past treatment for cervical cancer or a condition that could lead to cancer, you need Pap tests and screening for cancer for at least 20 years after your treatment. If Pap tests have been discontinued, risk factors (such as a new sexual partner) need to be reassessed to determine if screening should be resumed. Some women have medical problems that increase the chance of getting cervical cancer. In these cases, your caregiver may recommend more frequent screening and Pap tests.  The human papillomavirus (HPV) test is an additional test that may be used for cervical cancer screening. The HPV test looks for the virus that can cause the cell changes on the cervix. The cells collected during the Pap test can be tested for HPV. The HPV test could be used to screen women aged 44 years and older, and should be used in women of any age  who have unclear Pap test results. After the age of 55, women should have HPV testing at the same frequency as a Pap test.  Colorectal cancer can be detected and often prevented. Most routine colorectal cancer screening begins at the age of 44 and continues through age 20. However, your caregiver may recommend screening at an earlier age if you have risk factors for colon cancer. On a yearly basis, your caregiver may provide home test kits to check for hidden blood in the stool. Use of a small camera at the end of a tube, to directly examine the colon (sigmoidoscopy or colonoscopy), can detect the earliest forms of colorectal cancer. Talk to your caregiver about this at age 86, when routine screening begins. Direct examination of the colon should be repeated every 5 to 10 years through age 13, unless early forms of pre-cancerous polyps or small growths are found.  Hepatitis C blood testing is recommended for all people born from [birth year] through 1967 and any  individual with known risks for hepatitis C.  Practice safe sex. Use condoms and avoid high-risk sexual practices to reduce the spread of sexually transmitted infections (STIs). Sexually active women aged 36 and younger should be checked for Chlamydia, which is a common sexually transmitted infection. Older women with new or multiple partners should also be tested for Chlamydia. Testing for other STIs is recommended if you are sexually active and at increased risk.  Osteoporosis is a disease in which the bones lose minerals and strength with aging. This can result in serious bone fractures. The risk of osteoporosis can be identified using a bone density scan. Women ages 20 and over and women at risk for fractures or osteoporosis should discuss screening with their caregivers. Ask your caregiver whether you should be taking a calcium supplement or vitamin D to reduce the rate of osteoporosis.  Menopause can be associated with physical symptoms and risks. Hormone replacement therapy is available to decrease symptoms and risks. You should talk to your caregiver about whether hormone replacement therapy is right for you.  Use sunscreen. Apply sunscreen liberally and repeatedly throughout the day. You should seek shade when your shadow is shorter than you. Protect yourself by wearing long sleeves, pants, a wide-brimmed hat, and sunglasses year round, whenever you are outdoors.  Notify your caregiver of new moles or changes in moles, especially if there is a change in shape or color. Also notify your caregiver if a mole is larger than the size of a pencil eraser.  Stay current with your immunizations. Document Released: 03/31/2011 Document Revised: 01/10/2013 Document Reviewed: 03/31/2011 [facility] Patient Information 2014 [place].

## 2014-09-14 NOTE — Progress Notes (Signed)
Theresa PresumeLisa Bass 08-Aug-1966 [id number]        48 y.o.  [id number] for annual exam.  Several issues noted below.  Past medical history,surgical history, problem list, medications, allergies, family history and social history were all reviewed and documented as reviewed in the EPIC chart.  ROS:  Performed with pertinent positives and negatives included in the history, assessment and plan.   Additional significant findings :  none   Exam: [facility] personassistant Filed Vitals:   09/14/14 0924  BP: 122/82  Height: 5\' 1"  (1.549 m)  Weight: 135 lb (61.236 kg)   General appearance:  Normal affect, orientation and appearance. Skin: Grossly normal HEENT: Without gross lesions.  No cervical or supraclavicular adenopathy. Thyroid normal.  Lungs:  Clear without wheezing, rales or rhonchi Cardiac: RR, without RMG Abdominal:  Soft, nontender, without masses, guarding, rebound, organomegaly or hernia Breasts:  Examined lying and sitting without masses, retractions, discharge or axillary adenopathy. Pelvic:  Ext/BUS/vagina normal  Cervix normal  Uterus anteverted, normal size, shape and contour, midline and mobile nontender   Adnexa  Without masses or tenderness    Anus and perineum  Normal   Rectovaginal  Normal sphincter tone without palpated masses or tenderness.    Assessment/Plan:  48 y.o. [id number] female for annual exam.   1. Postmenopausal/HRT.patient continues on minivelle 0.1 mg and Prometrium 200 mg for the first 12 days. Having predictable 3-5 day periods at the end of her Prometrium. No other bleeding. Discussed switching to a continuous progesterone to eliminate bleeding and she wants to go ahead and do this. I again reviewed the risks of HRT to include increased risk of stroke heart attack DVT and breast cancer. Patient feels great on HRT wants to continue. Minivelle 0.1 mg 1 year. Prometrium 100 mg nightly 1 year. Call if irregular bleeding or any issues. 2. Migraines. Patient uses Amerge with  good success. #9 with 3 refills provided. 3. Mammography never. I again strongly recommended screening mammography. Most common cancer in women reviewed and the benefits of early detection discussed. Patient again states that she will go ahead and schedule this. SBE monthly reviewed. 4. Colonoscopy. Patient just turned 48 and I recommended screening colonoscopy and she is going to arrange this over the next year. 5. Pap smear/HPV 2014. No Pap smear done today. History of ASCUS 2012 insufficient for HPV. Pap smear 2013 negative. Follow up Pap smear 2014 with HPV negative.  Plan repeat Pap smear at 3 to 5year interval per current screening guidelines. 6. Health maintenance. Screening CBC comprehensive metabolic panel lipid profile TSH vitamin D urinalysis ordered. I also ordered a hepatitis C screen per CDC recommendations as she is in the recommended age range. Follow up in one year, sooner if any issues with the new HRT regimen     [name],TIMOTHY P MD, 10:00 AM 09/14/2014

## 2014-09-15 LAB — COMPREHENSIVE METABOLIC PANEL
ALT: 17 U/L (ref 0–35)
AST: 18 U/L (ref 0–37)
Albumin: 4.3 g/dL (ref 3.5–5.2)
Alkaline Phosphatase: 56 U/L (ref 39–117)
BILIRUBIN TOTAL: 0.5 mg/dL (ref 0.2–1.2)
BUN: 8 mg/dL (ref 6–23)
CHLORIDE: 103 meq/L (ref 96–112)
CO2: 25 mEq/L (ref 19–32)
CREATININE: 0.81 mg/dL (ref 0.50–1.10)
Calcium: 9.2 mg/dL (ref 8.4–10.5)
Glucose, Bld: 81 mg/dL (ref 70–99)
Potassium: 4.2 mEq/L (ref 3.5–5.3)
Sodium: 138 mEq/L (ref 135–145)
Total Protein: 6.9 g/dL (ref 6.0–8.3)

## 2014-09-15 LAB — CBC WITH DIFFERENTIAL/PLATELET
BASOS PCT: 0 % (ref 0–1)
Basophils Absolute: 0 10*3/uL (ref 0.0–0.1)
EOS PCT: 3 % (ref 0–5)
Eosinophils Absolute: 0.1 10*3/uL (ref 0.0–0.7)
HCT: 39.4 % (ref 36.0–46.0)
Hemoglobin: 13.4 g/dL (ref 12.0–15.0)
Lymphocytes Relative: 34 % (ref 12–46)
Lymphs Abs: 1.3 10*3/uL (ref 0.7–4.0)
MCH: 28.9 pg (ref 26.0–34.0)
MCHC: 34 g/dL (ref 30.0–36.0)
MCV: 85.1 fL (ref 78.0–100.0)
MONO ABS: 0.2 10*3/uL (ref 0.1–1.0)
MPV: 9.9 fL (ref 9.4–12.4)
Monocytes Relative: 6 % (ref 3–12)
NEUTROS ABS: 2.2 10*3/uL (ref 1.7–7.7)
Neutrophils Relative %: 57 % (ref 43–77)
PLATELETS: 257 10*3/uL (ref 150–400)
RBC: 4.63 MIL/uL (ref 3.87–5.11)
RDW: 13.6 % (ref 11.5–15.5)
WBC: 3.9 10*3/uL — ABNORMAL LOW (ref 4.0–10.5)

## 2014-09-15 LAB — LIPID PANEL
CHOL/HDL RATIO: 2.3 ratio
Cholesterol: 169 mg/dL (ref 0–200)
HDL: 75 mg/dL (ref >39)
LDL Cholesterol: 78 mg/dL (ref 0–99)
Triglycerides: 79 mg/dL (ref <150)
VLDL: 16 mg/dL (ref 0–40)

## 2014-09-15 LAB — TSH: TSH: 2.098 u[IU]/mL (ref 0.350–4.500)

## 2014-09-15 LAB — HEPATITIS C ANTIBODY: HCV AB: NEGATIVE

## 2014-09-16 LAB — VITAMIN D 25 HYDROXY (VIT D DEFICIENCY, FRACTURES): VIT D 25 HYDROXY: 25 ng/mL — AB (ref 30–100)

## 2014-09-18 ENCOUNTER — Encounter: Payer: Self-pay | Admitting: Gynecology

## 2015-08-21 ENCOUNTER — Other Ambulatory Visit: Payer: Self-pay | Admitting: Gynecology

## 2015-08-22 ENCOUNTER — Other Ambulatory Visit: Payer: Self-pay | Admitting: Gynecology

## 2015-09-18 ENCOUNTER — Other Ambulatory Visit: Payer: Self-pay | Admitting: Gynecology

## 2015-10-19 ENCOUNTER — Ambulatory Visit (INDEPENDENT_AMBULATORY_CARE_PROVIDER_SITE_OTHER): Payer: BLUE CROSS/BLUE SHIELD | Admitting: Gynecology

## 2015-10-19 ENCOUNTER — Encounter: Payer: Self-pay | Admitting: Gynecology

## 2015-10-19 VITALS — BP 120/74 | Ht 61.0 in | Wt 143.0 lb

## 2015-10-19 DIAGNOSIS — Z13220 Encounter for screening for lipoid disorders: Secondary | ICD-10-CM

## 2015-10-19 DIAGNOSIS — Z79890 Hormone replacement therapy (postmenopausal): Secondary | ICD-10-CM

## 2015-10-19 DIAGNOSIS — E559 Vitamin D deficiency, unspecified: Secondary | ICD-10-CM

## 2015-10-19 DIAGNOSIS — Z01419 Encounter for gynecological examination (general) (routine) without abnormal findings: Secondary | ICD-10-CM | POA: Diagnosis not present

## 2015-10-19 DIAGNOSIS — N926 Irregular menstruation, unspecified: Secondary | ICD-10-CM

## 2015-10-19 LAB — URINALYSIS W MICROSCOPIC + REFLEX CULTURE
BILIRUBIN URINE: NEGATIVE
Bacteria, UA: NONE SEEN [HPF]
Casts: NONE SEEN [LPF]
Crystals: NONE SEEN [HPF]
Glucose, UA: NEGATIVE
Hgb urine dipstick: NEGATIVE
KETONES UR: NEGATIVE
LEUKOCYTES UA: NEGATIVE
Nitrite: NEGATIVE
PH: 7.5 (ref 5.0–8.0)
PROTEIN: NEGATIVE
Specific Gravity, Urine: 1.019 (ref 1.001–1.035)
WBC UA: NONE SEEN WBC/HPF (ref <=5)
Yeast: NONE SEEN [HPF]

## 2015-10-19 LAB — COMPREHENSIVE METABOLIC PANEL
ALBUMIN: 4.3 g/dL (ref 3.6–5.1)
ALK PHOS: 61 U/L (ref 33–130)
ALT: 14 U/L (ref 6–29)
AST: 17 U/L (ref 10–35)
BUN: 9 mg/dL (ref 7–25)
CHLORIDE: 103 mmol/L (ref 98–110)
CO2: 28 mmol/L (ref 20–31)
Calcium: 9 mg/dL (ref 8.6–10.4)
Creat: 0.86 mg/dL (ref 0.50–1.05)
Glucose, Bld: 77 mg/dL (ref 65–99)
POTASSIUM: 3.9 mmol/L (ref 3.5–5.3)
Sodium: 137 mmol/L (ref 135–146)
TOTAL PROTEIN: 7.1 g/dL (ref 6.1–8.1)
Total Bilirubin: 0.4 mg/dL (ref 0.2–1.2)

## 2015-10-19 LAB — CBC WITH DIFFERENTIAL/PLATELET
BASOS PCT: 0 % (ref 0–1)
Basophils Absolute: 0 10*3/uL (ref 0.0–0.1)
EOS ABS: 0.2 10*3/uL (ref 0.0–0.7)
Eosinophils Relative: 4 % (ref 0–5)
HCT: 41 % (ref 36.0–46.0)
HEMOGLOBIN: 13.9 g/dL (ref 12.0–15.0)
Lymphocytes Relative: 27 % (ref 12–46)
Lymphs Abs: 1.3 10*3/uL (ref 0.7–4.0)
MCH: 29.1 pg (ref 26.0–34.0)
MCHC: 33.9 g/dL (ref 30.0–36.0)
MCV: 85.8 fL (ref 78.0–100.0)
MONO ABS: 0.3 10*3/uL (ref 0.1–1.0)
MPV: 10.1 fL (ref 8.6–12.4)
Monocytes Relative: 6 % (ref 3–12)
Neutro Abs: 3 10*3/uL (ref 1.7–7.7)
Neutrophils Relative %: 63 % (ref 43–77)
PLATELETS: 249 10*3/uL (ref 150–400)
RBC: 4.78 MIL/uL (ref 3.87–5.11)
RDW: 13.2 % (ref 11.5–15.5)
WBC: 4.7 10*3/uL (ref 4.0–10.5)

## 2015-10-19 LAB — LIPID PANEL
Cholesterol: 176 mg/dL (ref 125–200)
HDL: 71 mg/dL (ref >=46)
LDL CALC: 79 mg/dL (ref <130)
Total CHOL/HDL Ratio: 2.5 Ratio (ref <=5.0)
Triglycerides: 131 mg/dL (ref <150)
VLDL: 26 mg/dL (ref <30)

## 2015-10-19 LAB — FOLLICLE STIMULATING HORMONE: FSH: 23.7 m[IU]/mL

## 2015-10-19 LAB — TSH: TSH: 1.419 u[IU]/mL (ref 0.350–4.500)

## 2015-10-19 MED ORDER — ESTRADIOL 0.1 MG/24HR TD PTTW: MEDICATED_PATCH | TRANSDERMAL | Status: DC

## 2015-10-19 MED ORDER — NARATRIPTAN HCL 2.5 MG PO TABS: ORAL_TABLET | ORAL | Status: DC

## 2015-10-19 MED ORDER — PROGESTERONE MICRONIZED 100 MG PO CAPS: ORAL_CAPSULE | ORAL | Status: DC

## 2015-10-19 NOTE — Patient Instructions (Signed)
Follow up for the ultrasound as scheduled.  Call to Schedule your mammogram  Facilities in [place]: 1)  [facility], [phone] Phone: [phone] 2)  Dr. Yolanda Bonine at [facility] [address] Phone: [phone]     Mammogram A mammogram is an X-ray test to find changes in a woman's breast. You should get a mammogram if:  You are 51 years of age or older  You have risk factors.   Your doctor recommends that you have one.  BEFORE THE TEST  Do not schedule the test the week before your period, especially if your breasts are sore during this time.  On the day of your mammogram:  Wash your breasts and armpits well. After washing, do not put on any deodorant or talcum powder on until after your test.   Eat and drink as you usually do.   Take your medicines as usual.   If you are diabetic and take insulin, make sure you:   Eat before coming for your test.   Take your insulin as usual.   If you cannot keep your appointment, call before the appointment to cancel. Schedule another appointment.  TEST  You will need to undress from the waist up. You will put on a hospital gown.   Your breast will be put on the mammogram machine, and it will press firmly on your breast with a piece of plastic called a compression paddle. This will make your breast flatter so that the machine can X-ray all parts of your breast.   Both breasts will be X-rayed. Each breast will be X-rayed from above and from the side. An X-ray might need to be taken again if the picture is not good enough.   The mammogram will last about 15 to 30 minutes.  AFTER THE TEST Finding out the results of your test Ask when your test results will be ready. Make sure you get your test results.  Document Released: 12/12/2008 Document Revised: 09/04/2011 Document Reviewed: 12/12/2008 [facility] Patient Information 2012 [place],  [place].  Schedule your colonoscopy with either:  [facility]   Address: [address] [place], [place], [place] [address]  [phone]    or  [facility]  Address: [address] [place], [place], [place] [address]  [phone]

## 2015-10-19 NOTE — Progress Notes (Signed)
Theresa Bass [birth year] [id number]        50 y.o.  [id number]  for annual exam.  Several issues noted below  Past medical history,surgical history, problem list, medications, allergies, family history and social history were all reviewed and documented as reviewed in the EPIC chart.  ROS:  Performed with pertinent positives and negatives included in the history, assessment and plan.   Additional significant findings :  none   Exam: [place] assistant Filed Vitals:   10/19/15 0849  BP: 120/74  Height:  (1.549 m)  Weight: 143 lb (64.864 kg)   General appearance:  Normal affect, orientation and appearance. Skin: Grossly normal HEENT: Without gross lesions.  No cervical or supraclavicular adenopathy. Thyroid normal.  Lungs:  Clear without wheezing, rales or rhonchi Cardiac: RR, without RMG Abdominal:  Soft, nontender, without masses, guarding, rebound, organomegaly or hernia Breasts:  Examined lying and sitting without masses, retractions, discharge or axillary adenopathy. Pelvic:  Ext/BUS/vagina normal  Cervix normal  Uterus axial to retroverted, normal size, shape and contour, midline and mobile nontender   Adnexa  Without masses or tenderness    Anus and perineum  Normal   Rectovaginal  Normal sphincter tone without palpated masses or tenderness.    Assessment/Plan:  50 y.o. [id number] female for annual exam.   1. Irregular bleeding. Patient on HRT minivelle 0.1 mg and Prometrium 100 mg nightly. Has sporadic bleeding throughout the year reported as regular menses. Last for several days and then resolves. Associated with migraines that she usually has with her menses. Had been taking Prometrium 200 mg for the first 12 days each month previously cannot remember whether she had any irregular bleeding with that or just at the end of her Prometrium. Recommend baseline sonohysterogram to rule out endometrial abnormalities. Also check a baseline FSH. Again discussed the risks of HRT to  include increased risk of stroke heart attack DVT and breast cancer. Patient feels good on it and wants to continue. I refilled her for both estrogen and progesterone 3 months. Will follow up for her sonohysterogram and then we will talk about dose adjustment if needed. 2. Migraines. Patient does have migraines vertically with this bleeding. Does well with Amerge. #9 with 4 refills provided. 3. Mammography never. I again strongly recommended that she schedule a screening mammogram. Possible increased risks of breast cancer with her HRT, benefits of early detection all reviewed. Patient promises me that she will scheduled this year. Names and numbers provided. SBE monthly reviewed. 4. Colonoscopy. Patient is 55. I've recommended that she schedule a screening colonoscopy. Patient acknowledges my recommendation but is not ready to do so at this point. She understands the risks colon cancer. Names and numbers provided. 5. Pap smear HPV negative 2014. No Pap smear done today. History of ASCUS 2012. Follow up Pap smears normal. 6. DEXA never. We'll plan further into the menopause. 7. Health maintenance. History of low vitamin D previously. Is not taking external vitamin D is recommended. Check baseline CBC, comprehensive metabolic panel, lipid profile, TSH, FSH, vitamin D, urinalysis. Follow up for sonohysterogram.   Dara Lords MD, 9:25 AM 10/19/2015

## 2015-10-20 LAB — VITAMIN D 25 HYDROXY (VIT D DEFICIENCY, FRACTURES): VIT D 25 HYDROXY: 33 ng/mL (ref 30–100)

## 2015-10-21 LAB — URINE CULTURE: Colony Count: 75000

## 2015-10-22 ENCOUNTER — Other Ambulatory Visit: Payer: Self-pay | Admitting: Gynecology

## 2015-10-22 DIAGNOSIS — N939 Abnormal uterine and vaginal bleeding, unspecified: Secondary | ICD-10-CM

## 2015-10-24 ENCOUNTER — Telehealth: Payer: Self-pay | Admitting: Gynecology

## 2015-10-24 NOTE — Telephone Encounter (Signed)
10/24/15-I let pt know today that her [facility] will cover the sonohysterogram with her $40 copay. Per [email or web address]@BC -[id number]. No p/a needed.wl

## 2015-10-29 ENCOUNTER — Ambulatory Visit (INDEPENDENT_AMBULATORY_CARE_PROVIDER_SITE_OTHER): Payer: BLUE CROSS/BLUE SHIELD

## 2015-10-29 ENCOUNTER — Other Ambulatory Visit: Payer: Self-pay | Admitting: Gynecology

## 2015-10-29 ENCOUNTER — Encounter: Payer: Self-pay | Admitting: Gynecology

## 2015-10-29 ENCOUNTER — Ambulatory Visit (INDEPENDENT_AMBULATORY_CARE_PROVIDER_SITE_OTHER): Payer: BLUE CROSS/BLUE SHIELD | Admitting: Gynecology

## 2015-10-29 VITALS — BP 120/80

## 2015-10-29 DIAGNOSIS — N83201 Unspecified ovarian cyst, right side: Secondary | ICD-10-CM

## 2015-10-29 DIAGNOSIS — N926 Irregular menstruation, unspecified: Secondary | ICD-10-CM

## 2015-10-29 DIAGNOSIS — N939 Abnormal uterine and vaginal bleeding, unspecified: Secondary | ICD-10-CM

## 2015-10-29 DIAGNOSIS — Z79890 Hormone replacement therapy (postmenopausal): Secondary | ICD-10-CM

## 2015-10-29 NOTE — Patient Instructions (Signed)
Start on the higher progesterone daily dose of 200 mg Prometrium. Continue on the daily estrogen. Call me if irregular bleeding continues.

## 2015-10-29 NOTE — Progress Notes (Signed)
Theresa Bass 1966-07-18 [id number]        50 y.o.  [id number] Presents for sonohysterogram.  Has been on HRT to include minivelle 0.1 mg and initially Prometrium 200 mg for 12 days. Was having some sporadic bleeding this past year. Could not remember if it correlated to the end of the Prometrium. We switched her to continuous progesterone at 100 mg Prometrium nightly. She has had some spotting on and off.  Recent FSH 23.  Past medical history,surgical history, problem list, medications, allergies, family history and social history were all reviewed and documented in the EPIC chart.  Directed ROS with pertinent positives and negatives documented in the history of present illness/assessment and plan.  Exam: [place] assistant Filed Vitals:   10/29/15 1043  BP: 120/80   General appearance:  Normal Pelvic external BUS vagina normal. Cervix normal. Uterus anteverted normal size midline mobile nontender. Adnexa without masses or tenderness.  Ultrasound shows uterus normal echotexture with endometrial echo 10.5 mm. Right ovary with small thin-walled echo-free follicle 14 x 15 mm. Left ovary atrophic. Cul-de-sac negative.  Sonohysterogram performed, sterile technique, single-toothed tenaculum anterior lip stabilization with slight dilatation required to place the catheter. Good distention with no abnormalities. Endometrial sample taken. Patient tolerated well.  Assessment/Plan:  50 y.o. [id number] with perimenopausal bleeding on HRT. Discussed possible low-dose birth control pills but she has difficulty tolerating birth control pills due to her migraine flares. Alternatives to include trying different hormonal combinations reviewed. She does feel she needs he estrogen from a symptom relief standpoint. Ultimately with decided to continue on the estrogen patch and increase her Prometrium to 200 mg nightly and see how she does with this if this will not eliminate her bleeding. Patient will call me in a month  or so to let me know how she is doing. She has a supply at home for now. She will follow up for the endometrial biopsy results.    Dara Lords MD, 11:27 AM 10/29/2015

## 2015-11-20 ENCOUNTER — Telehealth: Payer: Self-pay | Admitting: *Deleted

## 2015-11-20 MED ORDER — ESTRADIOL 0.1 MG/24HR TD PTTW: 1.0000 | MEDICATED_PATCH | TRANSDERMAL | Status: DC

## 2015-11-20 NOTE — Telephone Encounter (Signed)
Pt Rx minivelle 0.1 mg is on manufactory back order, pt will need alterative medication due to this. They do have vivelle dot 0.1mg  twice weekly or climara patch 0.1 mg once weekly. Please advise

## 2015-11-20 NOTE — Telephone Encounter (Signed)
Rx sent pharmacy will fill for pt.

## 2015-11-20 NOTE — Telephone Encounter (Signed)
Vivelle dot 0.1 mg okay

## 2016-01-15 ENCOUNTER — Telehealth: Payer: Self-pay

## 2016-01-15 ENCOUNTER — Other Ambulatory Visit: Payer: Self-pay | Admitting: Gynecology

## 2016-01-15 MED ORDER — PROGESTERONE MICRONIZED 200 MG PO CAPS: 200.0000 mg | ORAL_CAPSULE | Freq: Every day | ORAL | Status: DC

## 2016-01-15 NOTE — Telephone Encounter (Signed)
I contacted patient and left detailed message per DPR access note. I let patient know that [facility] refill request for 100 mg Prometrium and that I had denied it per last office note.  I sent Rx for 200 mg Prometrium to be taken q hs. I told her Dr. Kristie CowmanF's note had mentioned she would call to report after one month or so on it and since we had not heard from her I assumed she was doing well on it.  If she had a problem with it to let us know.

## 2016-01-30 ENCOUNTER — Telehealth: Payer: Self-pay

## 2016-01-30 NOTE — Telephone Encounter (Signed)
Patient has been on 100 mg Prometrium nightly until annual exam and you increased her to 200 mg hs.  She said she tried that but it gives her "an instant migraine".  She went back on the 100 mg and wants to stay on that nightly inspite of the irregular spotting/bleeding. Said she simply cannot take the higher dose.

## 2016-01-31 ENCOUNTER — Other Ambulatory Visit: Payer: Self-pay | Admitting: Gynecology

## 2016-01-31 MED ORDER — PROGESTERONE MICRONIZED 100 MG PO CAPS: 100.0000 mg | ORAL_CAPSULE | Freq: Every day | ORAL | Status: DC

## 2016-01-31 NOTE — Telephone Encounter (Signed)
Okay for now

## 2016-01-31 NOTE — Telephone Encounter (Signed)
Rx sent 

## 2016-01-31 NOTE — Telephone Encounter (Signed)
Spoke with patient and let her know.  

## 2016-06-16 ENCOUNTER — Other Ambulatory Visit: Payer: Self-pay | Admitting: Gynecology

## 2016-06-16 NOTE — Telephone Encounter (Signed)
Called into pharmacy

## 2016-09-19 ENCOUNTER — Other Ambulatory Visit: Payer: Self-pay | Admitting: Gynecology

## 2016-10-13 ENCOUNTER — Encounter: Payer: Self-pay | Admitting: Gynecology

## 2016-10-13 ENCOUNTER — Ambulatory Visit (INDEPENDENT_AMBULATORY_CARE_PROVIDER_SITE_OTHER): Payer: BLUE CROSS/BLUE SHIELD | Admitting: Gynecology

## 2016-10-13 VITALS — BP 120/76

## 2016-10-13 DIAGNOSIS — N898 Other specified noninflammatory disorders of vagina: Secondary | ICD-10-CM | POA: Diagnosis not present

## 2016-10-13 LAB — WET PREP FOR TRICH, YEAST, CLUE
Trich, Wet Prep: NONE SEEN
WBC WET PREP: NONE SEEN
YEAST WET PREP: NONE SEEN

## 2016-10-13 MED ORDER — FLUCONAZOLE 150 MG PO TABS: 150.0000 mg | ORAL_TABLET | Freq: Every day | ORAL | 0 refills | Status: DC

## 2016-10-13 MED ORDER — METRONIDAZOLE 500 MG PO TABS: 500.0000 mg | ORAL_TABLET | Freq: Two times a day (BID) | ORAL | 0 refills | Status: DC

## 2016-10-13 NOTE — Progress Notes (Signed)
    Theresa PresumeLisa Bass April 21, 1966 [id number]        51 y.o.  [id number] presents with 2 week history of worsening vaginal irritation and itching. No odor. Slight discharge. Used OTC Monistat yesterday but symptoms seem worse. No urinary symptoms such as frequency dysuria or urgency low back pain fever or chills. No nausea vomiting diarrhea constipation.  Past medical history,surgical history, problem list, medications, allergies, family history and social history were all reviewed and documented in the EPIC chart.  Directed ROS with pertinent positives and negatives documented in the history of present illness/assessment and plan.  Exam: Kennon PortelaKim Gardner assistant Vitals:   10/13/16 1203  BP: 120/76   General appearance:  Normal Spine straight without CVA tenderness Abdomen soft nontender without masses guarding rebound Pelvic external BUS vagina with white cream noted. Cervix normal. Uterus normal size midline mobile nontender. Adnexa without masses or tenderness.  Assessment/Plan:  51 y.o. [id number] with above history. Wet prep did show some clue cells but was contaminated by the yeast cream. Will cover for yeast with Diflucan 150 mg daily 3 days and bacterial vaginosis with Flagyl 500 mg twice a day 7 days. Alcohol avoidance reviewed. Patient has annual exam scheduled in 2 weeks will follow up with this. Follow up sooner if her symptoms persist or worsen.    [name],Labresha Mellor P MD, 12:13 PM 10/13/2016

## 2016-10-13 NOTE — Patient Instructions (Signed)
Take the Diflucan pill daily for 3 days. Take the metronidazole pill twice daily for 7 days. Avoid alcohol taking. Follow up for your annual exam as scheduled in 2 weeks.

## 2016-10-13 NOTE — Addendum Note (Signed)
Addended by: Dayna BarkerGARDNER, KIMBERLY K on: 10/13/2016 02:44 PM   Modules accepted: Orders

## 2016-10-27 ENCOUNTER — Encounter: Payer: Self-pay | Admitting: Gynecology

## 2016-10-27 ENCOUNTER — Ambulatory Visit (INDEPENDENT_AMBULATORY_CARE_PROVIDER_SITE_OTHER): Payer: BLUE CROSS/BLUE SHIELD | Admitting: Gynecology

## 2016-10-27 VITALS — BP 130/80 | Ht 61.0 in | Wt 148.0 lb

## 2016-10-27 DIAGNOSIS — E559 Vitamin D deficiency, unspecified: Secondary | ICD-10-CM | POA: Diagnosis not present

## 2016-10-27 DIAGNOSIS — N951 Menopausal and female climacteric states: Secondary | ICD-10-CM | POA: Diagnosis not present

## 2016-10-27 DIAGNOSIS — Z01419 Encounter for gynecological examination (general) (routine) without abnormal findings: Secondary | ICD-10-CM | POA: Diagnosis not present

## 2016-10-27 LAB — CBC WITH DIFFERENTIAL/PLATELET
BASOS ABS: 0 {cells}/uL (ref 0–200)
BASOS PCT: 0 %
EOS PCT: 4 %
Eosinophils Absolute: 164 cells/uL (ref 15–500)
HCT: 40.3 % (ref 35.0–45.0)
HEMOGLOBIN: 13.3 g/dL (ref 11.7–15.5)
LYMPHS ABS: 984 {cells}/uL (ref 850–3900)
Lymphocytes Relative: 24 %
MCH: 28.4 pg (ref 27.0–33.0)
MCHC: 33 g/dL (ref 32.0–36.0)
MCV: 85.9 fL (ref 80.0–100.0)
MPV: 10 fL (ref 7.5–12.5)
Monocytes Absolute: 410 cells/uL (ref 200–950)
Monocytes Relative: 10 %
NEUTROS ABS: 2542 {cells}/uL (ref 1500–7800)
Neutrophils Relative %: 62 %
Platelets: 285 10*3/uL (ref 140–400)
RBC: 4.69 MIL/uL (ref 3.80–5.10)
RDW: 13.8 % (ref 11.0–15.0)
WBC: 4.1 10*3/uL (ref 3.8–10.8)

## 2016-10-27 LAB — COMPREHENSIVE METABOLIC PANEL
ALBUMIN: 4.2 g/dL (ref 3.6–5.1)
ALT: 23 U/L (ref 6–29)
AST: 18 U/L (ref 10–35)
Alkaline Phosphatase: 59 U/L (ref 33–130)
BILIRUBIN TOTAL: 0.4 mg/dL (ref 0.2–1.2)
BUN: 11 mg/dL (ref 7–25)
CO2: 23 mmol/L (ref 20–31)
CREATININE: 0.84 mg/dL (ref 0.50–1.05)
Calcium: 9.1 mg/dL (ref 8.6–10.4)
Chloride: 104 mmol/L (ref 98–110)
Glucose, Bld: 81 mg/dL (ref 65–99)
Potassium: 4.3 mmol/L (ref 3.5–5.3)
SODIUM: 138 mmol/L (ref 135–146)
TOTAL PROTEIN: 7 g/dL (ref 6.1–8.1)

## 2016-10-27 LAB — TSH: TSH: 1.9 m[IU]/L

## 2016-10-27 MED ORDER — NARATRIPTAN HCL 2.5 MG PO TABS: ORAL_TABLET | ORAL | 1 refills | Status: DC

## 2016-10-27 NOTE — Progress Notes (Signed)
    Theresa Bass May 17, 1966 [id number]        51 y.o.  [id number] for annual exam.    Past medical history,surgical history, problem list, medications, allergies, family history and social history were all reviewed and documented as reviewed in the EPIC chart.  ROS:  Performed with pertinent positives and negatives included in the history, assessment and plan.   Additional significant findings :  None   Exam: Biomedical scientistBlanca Bass Vitals:   10/27/16 0905  BP: 130/80  Weight: 148 lb (67.1 kg)  Height: 5\' 1"  (1.549 m)   Body mass index is 27.96 kg/m.  General appearance:  Normal affect, orientation and appearance. Skin: Grossly normal HEENT: Without gross lesions.  No cervical or supraclavicular adenopathy. Thyroid normal.  Lungs:  Clear without wheezing, rales or rhonchi Cardiac: RR, without RMG Abdominal:  Soft, nontender, without masses, guarding, rebound, organomegaly or hernia Breasts:  Examined lying and sitting without masses, retractions, discharge or axillary adenopathy. Pelvic:  Ext, BUS, Vagina normal  Cervix normal  Uterus anteverted, normal size, shape and contour, midline and mobile nontender   Adnexa without masses or tenderness    Anus and perineum normal   Rectovaginal normal sphincter tone without palpated masses or tenderness.    Assessment/Plan:  51 y.o. [id number] female for annual exam.   1. Menopausal symptoms. Had been on minivelle 0.1 mg and Prometrium 100 mg. Ran out last week. Does note hot flushes and night sweats regardless of being on the patch. Having monthly menses. FSH borderline at 23 last year. Check FSH/TSH now. Options for management reviewed. Patient prefers to stop HRT for now and see what she does. Will call if develops unacceptable side effects and wants to reinitiate. Will keep menstrual calendar and follow up if significant irregular bleeding. Had sonohysterogram last year which was negative with negative endometrial biopsy. Risks of HRT  reviewed to include stroke heart attack DVT and breast cancer issues. She does have a history of migraines and increased risk of stroke possibly discussed. Benefits as far as symptom relief and cardiovascular/bone health when started early also discussed. 2. Vaginal dryness with some irritation. Recently treated for vaginitis. Those symptoms have resolved. Will monitor her symptoms for now follow up if continues to be an issue. 3. Migraines. Uses Amerge which does well for her. #9 with one refill provided. 4. Mammography never. I again strongly recommended she schedule a screening mammography. Breast cancer as the most common cancer in women discussed. Patient promises she will schedule. SBE monthly reviewed. 5. Colonoscopy never. Patient refuses to schedule at this time. Understands the risks of colon cancer and benefits of screening. 6. Pap smear/HPV 08/2013. No Pap smear done today. History of ASCUS 2012 with negative Pap smears since then. Plan repeat Pap smear approaching 5 year interval per current screening guidelines. 7. Health maintenance. Baseline CBC, CMP FSH, TSH, vitamin D ordered. Has a history of low vitamin D in the past. Normal lipid profile last not repeated. Blood pressure 130/80 discussed and recommended to recheck the none exam situation. Follow up in one year, sooner if any issues.   Theresa Bass, 9:24 AM 10/27/2016

## 2016-10-27 NOTE — Patient Instructions (Signed)
Call to Schedule your mammogram  Facilities in [place]: 1)  [facility]. [facility], [address] Phone: [phone] 2)  Dr. Bertrand at [facility]  [address] Phone: [phone]     Mammogram A mammogram is an X-ray test to find changes in a woman's breast. You should get a mammogram if:  You are 53 years of age or older  You have risk factors.   Your doctor recommends that you have one.  BEFORE THE TEST  Do not schedule the test the week before your period, especially if your breasts are sore during this time.  On the day of your mammogram:  Wash your breasts and armpits well. After washing, do not put on any deodorant or talcum powder on until after your test.   Eat and drink as you usually do.   Take your medicines as usual.   If you are diabetic and take insulin, make sure you:   Eat before coming for your test.   Take your insulin as usual.   If you cannot keep your appointment, call before the appointment to cancel. Schedule another appointment.  TEST  You will need to undress from the waist up. You will put on a hospital gown.   Your breast will be put on the mammogram machine, and it will press firmly on your breast with a piece of plastic called a compression paddle. This will make your breast flatter so that the machine can X-ray all parts of your breast.   Both breasts will be X-rayed. Each breast will be X-rayed from above and from the side. An X-ray might need to be taken again if the picture is not good enough.   The mammogram will last about 15 to 30 minutes.  AFTER THE TEST Finding out the results of your test Ask when your test results will be ready. Make sure you get your test results.  Document Released: 12/12/2008 Document Revised: 09/04/2011 Document Reviewed: 12/12/2008 [employer] Patient Information 2012 [employer].   

## 2016-10-28 LAB — VITAMIN D 25 HYDROXY (VIT D DEFICIENCY, FRACTURES): Vit D, 25-Hydroxy: 38 ng/mL (ref 30–100)

## 2016-10-28 LAB — FOLLICLE STIMULATING HORMONE: FSH: 26.6 m[IU]/mL

## 2016-11-11 ENCOUNTER — Telehealth: Payer: Self-pay | Admitting: *Deleted

## 2016-11-11 MED ORDER — ESTRADIOL 0.1 MG/24HR TD PTTW: 1.0000 | MEDICATED_PATCH | TRANSDERMAL | 3 refills | Status: DC

## 2016-11-11 MED ORDER — PROGESTERONE MICRONIZED 100 MG PO CAPS: 100.0000 mg | ORAL_CAPSULE | Freq: Every day | ORAL | 3 refills | Status: DC

## 2016-11-11 NOTE — Telephone Encounter (Signed)
Pt called requesting to start back on HRT,c/o hot flashes, night sweats, no sleep at night, has been spotting since OV 10/27/16 as well. Okay to start back on vivelle-dot 0.1 mg patch and progesterone 100 mg 1 po at bedtime? Please advise

## 2016-11-11 NOTE — Telephone Encounter (Signed)
Pt aware, both Rx sent with refills until annual

## 2016-11-11 NOTE — Telephone Encounter (Signed)
Okay to start back with refills through one year.

## 2017-01-20 DIAGNOSIS — M25521 Pain in right elbow: Secondary | ICD-10-CM | POA: Diagnosis not present

## 2017-01-22 DIAGNOSIS — M7711 Lateral epicondylitis, right elbow: Secondary | ICD-10-CM | POA: Diagnosis not present

## 2017-01-26 ENCOUNTER — Other Ambulatory Visit: Payer: Self-pay | Admitting: Gynecology

## 2017-01-26 DIAGNOSIS — M7711 Lateral epicondylitis, right elbow: Secondary | ICD-10-CM | POA: Diagnosis not present

## 2017-01-29 DIAGNOSIS — M7711 Lateral epicondylitis, right elbow: Secondary | ICD-10-CM | POA: Diagnosis not present

## 2017-02-04 DIAGNOSIS — M7711 Lateral epicondylitis, right elbow: Secondary | ICD-10-CM | POA: Diagnosis not present

## 2017-02-11 DIAGNOSIS — M7711 Lateral epicondylitis, right elbow: Secondary | ICD-10-CM | POA: Diagnosis not present

## 2017-02-13 ENCOUNTER — Encounter (INDEPENDENT_AMBULATORY_CARE_PROVIDER_SITE_OTHER): Payer: Self-pay

## 2017-02-13 ENCOUNTER — Ambulatory Visit (INDEPENDENT_AMBULATORY_CARE_PROVIDER_SITE_OTHER): Payer: BLUE CROSS/BLUE SHIELD | Admitting: Sports Medicine

## 2017-02-13 ENCOUNTER — Encounter: Payer: Self-pay | Admitting: Sports Medicine

## 2017-02-13 VITALS — BP 160/81 | Ht 60.0 in | Wt 140.0 lb

## 2017-02-13 DIAGNOSIS — M25521 Pain in right elbow: Secondary | ICD-10-CM

## 2017-02-13 NOTE — Progress Notes (Signed)
   Subjective:    Patient ID: Theresa Bass, female    DOB: 1966-03-19, 51 y.o.   MRN: [id number]  HPI chief complaint: Right elbow pain  Very pleasant right-hand-dominant female comes in today complaining of 2-1/2 months of right elbow pain. She had a sudden onset of pain while moving files. [facility]. An ultrasound of her lateral epicondyle was done. She was told that no tear was seen but she had quite a bit of inflammation. She was given a wrist brace and placed on prednisone but that did not help. She has been seeing a physical therapist for the past 4 weeks. Despite this her symptoms have worsened. She localizes most of her pain to the lateral elbow. She also describes numbness intermittently in her right thumb. She denies any significant medial elbow pain. She has noticed swelling. She has limited range of motion of the elbow due to her pain. Her symptoms are worse with activities such as grabbing, lifting, and pulling. She does note that she is able to type without any difficulty. She has taken Advil but it has not helped. She has been using ice which helps temporarily. She denies similar problems with this elbow in the past.  Past medical history reviewed Medications reviewed Allergies reviewed    Review of Systems    as above Objective:   Physical Exam  Well-developed, well-nourished. No acute distress. Awake alert and oriented 3. Vital signs reviewed  Right elbow: Patient has full range of motion but has pain with full flexion and extension. No effusion. Moderate soft tissue swelling over the lateral epicondyle. She is exquisitely tender to palpation over the lateral epicondyle and has reproducible pain with resisted ECRB testing. Weakness with wrist extension due to pain. Reproducible pain with middle finger extension. No atrophy. Negative Tinel's over the cubital tunnel. Sensation appears to be intact to light touch. Good radial and ulnar  pulses.  [facility] (sometime around early April this year) are unremarkable per the patient's report.      Assessment & Plan:   Right elbow pain and swelling-rule out ECRB tendon tear  Patient has failed conservative treatment and her pain is definitely affecting her quality of life. I'm concerned she may have a tear of the ECRB tendon that may need surgical debridement. We will schedule an MRI to evaluate further. Phone follow-up with those results when available. In the meantime she will continue with her home exercises but will stop formal physical therapy since it has not been of much help.

## 2017-02-21 ENCOUNTER — Ambulatory Visit
Admission: RE | Admit: 2017-02-21 | Discharge: 2017-02-21 | Disposition: A | Discharge status: Home / Self Care | Payer: BLUE CROSS/BLUE SHIELD | Source: Ambulatory Visit | Attending: Sports Medicine | Admitting: Sports Medicine

## 2017-02-21 DIAGNOSIS — M25521 Pain in right elbow: Secondary | ICD-10-CM

## 2017-02-21 DIAGNOSIS — R2231 Localized swelling, mass and lump, right upper limb: Secondary | ICD-10-CM | POA: Diagnosis not present

## 2017-02-24 ENCOUNTER — Telehealth: Payer: Self-pay | Admitting: Sports Medicine

## 2017-02-24 NOTE — Telephone Encounter (Signed)
I spoke with the patient on the phone today after reviewing the MRI of her right elbow. She has a large partial-thickness tear of the common extensor tendon with severe tendinosis. Remainder of her elbow is unremarkable. Based on these findings I recommend surgical consultation with Dr. Thurston HoleWainer to discuss merits of debridement versus repair. Further workup and treatment will be per the discretion of Dr. Thurston HoleWainer and the patient will follow-up with me as needed.

## 2017-02-25 NOTE — Telephone Encounter (Signed)
[facility] Dr Thurston HoleWainer Monday 03/09/17 at 145pm [address]  [place] [phone] Arrival time is 1:15pm

## 2017-02-26 DIAGNOSIS — M7711 Lateral epicondylitis, right elbow: Secondary | ICD-10-CM | POA: Diagnosis not present

## 2017-03-20 DIAGNOSIS — M7711 Lateral epicondylitis, right elbow: Secondary | ICD-10-CM | POA: Diagnosis not present

## 2017-03-26 DIAGNOSIS — M7711 Lateral epicondylitis, right elbow: Secondary | ICD-10-CM | POA: Diagnosis not present

## 2017-04-03 DIAGNOSIS — M7711 Lateral epicondylitis, right elbow: Secondary | ICD-10-CM | POA: Diagnosis not present

## 2017-04-16 DIAGNOSIS — M7711 Lateral epicondylitis, right elbow: Secondary | ICD-10-CM | POA: Diagnosis not present

## 2017-04-29 DIAGNOSIS — M7711 Lateral epicondylitis, right elbow: Secondary | ICD-10-CM | POA: Diagnosis not present

## 2017-05-06 DIAGNOSIS — M7711 Lateral epicondylitis, right elbow: Secondary | ICD-10-CM | POA: Diagnosis not present

## 2017-05-13 DIAGNOSIS — M7711 Lateral epicondylitis, right elbow: Secondary | ICD-10-CM | POA: Diagnosis not present

## 2017-05-14 DIAGNOSIS — M7711 Lateral epicondylitis, right elbow: Secondary | ICD-10-CM | POA: Diagnosis not present

## 2017-07-02 ENCOUNTER — Other Ambulatory Visit: Payer: Self-pay | Admitting: Gynecology

## 2017-07-20 DIAGNOSIS — Z23 Encounter for immunization: Secondary | ICD-10-CM | POA: Diagnosis not present

## 2017-08-25 ENCOUNTER — Other Ambulatory Visit: Payer: Self-pay | Admitting: Gynecology

## 2017-10-10 ENCOUNTER — Other Ambulatory Visit: Payer: Self-pay | Admitting: Gynecology

## 2017-10-12 NOTE — Telephone Encounter (Signed)
CE scheduled 11/10/16.

## 2017-11-10 ENCOUNTER — Ambulatory Visit: Payer: BLUE CROSS/BLUE SHIELD | Admitting: Gynecology

## 2017-11-10 ENCOUNTER — Encounter: Payer: Self-pay | Admitting: Gynecology

## 2017-11-10 VITALS — BP 124/80 | Ht 61.0 in | Wt 153.0 lb

## 2017-11-10 DIAGNOSIS — N951 Menopausal and female climacteric states: Secondary | ICD-10-CM | POA: Diagnosis not present

## 2017-11-10 DIAGNOSIS — Z01419 Encounter for gynecological examination (general) (routine) without abnormal findings: Secondary | ICD-10-CM | POA: Diagnosis not present

## 2017-11-10 DIAGNOSIS — Z13220 Encounter for screening for lipoid disorders: Secondary | ICD-10-CM | POA: Diagnosis not present

## 2017-11-10 DIAGNOSIS — E559 Vitamin D deficiency, unspecified: Secondary | ICD-10-CM | POA: Diagnosis not present

## 2017-11-10 NOTE — Patient Instructions (Signed)
Follow-up for fasting blood work in a month.  Follow-up for your mammogram  Follow-up for your colonoscopy

## 2017-11-10 NOTE — Progress Notes (Signed)
Theresa Bass [birth year] [id number]        52 y.o.  [id number] for annual gynecologic exam.  Continues to have hot flushes and sweats.  Was to stop her HRT last year but never did.  Continues on minivelle 1 mg patch and Prometrium 100 mg.  She also continues to have monthly menses that last several days once a month.  She has exacerbations of her migraine headaches around this time.  Had a marginal elevated FSH last year at 926.  Past medical history,surgical history, problem list, medications, allergies, family history and social history were all reviewed and documented as reviewed in the EPIC chart.  ROS:  Performed with pertinent positives and negatives included in the history, assessment and plan.   Additional significant findings : None   Exam: Kennon PortelaKim Gardner assistant Vitals:   11/10/17 1154  BP: 124/80  Weight: 153 lb (69.4 kg)  Height: 5\' 1"  (1.549 m)   Body mass index is 28.91 kg/m.  General appearance:  Normal affect, orientation and appearance. Skin: Grossly normal HEENT: Without gross lesions.  No cervical or supraclavicular adenopathy. Thyroid normal.  Lungs:  Clear without wheezing, rales or rhonchi Cardiac: RR, without RMG Abdominal:  Soft, nontender, without masses, guarding, rebound, organomegaly or hernia Breasts:  Examined lying and sitting without masses, retractions, discharge or axillary adenopathy. Pelvic:  Ext, BUS, Vagina: Normal  Cervix: Normal.  Pap smear/HPV  Uterus: Anteverted, normal size, shape and contour, midline and mobile nontender   Adnexa: Without masses or tenderness    Anus and perineum: Normal   Rectovaginal: Normal sphincter tone without palpated masses or tenderness.    Assessment/Plan:  52 y.o. [id number] female for annual gynecologic exam with regular menses, tubal sterilization.   1. Menopausal symptoms.  Continues with hot flushes and sweats despite HRT supplementation.  She also continues with regular monthly menses.  Had marginal  elevated FSH last year.  Being migraines associated with her menses all pointing towards an ovulatory change.  Discussed various options with her to include estrogen alone and stop the Prometrium at this point and see how she does given that it appears that she is regularly ovulating.  Stopping HRT altogether and monitoring her symptoms also reviewed.  She is having migraines on and off.  We have discussed the issues and risks of HRT to include thrombosis such as stroke heart attack DVT and possible increased risk with migraines.  The breast cancer issue also reviewed.  At this point we both agreed to stop her HRT.  She will return in a month and will see [facility] and what her symptoms are doing.  She did have a sonohysterogram with biopsy 2017 which was negative. 2. Mammography never.  I again strongly recommended the patient schedule a mammogram.  Most common cancer in women reviewed as well as benefits of early detection.  Patient promises to schedule it this year.  Breast exam normal today. 3. Colonoscopy never.  Second most common cancer in women reviewed.  Recommended patient schedule a screening colonoscopy and she acknowledges my recommendations. 4. Pap smear/HPV 2014.  Pap smear/HPV done today.  History of ASCUS 2012 with negative Pap smears afterwards. 5. Health maintenance.  Future orders placed for CBC, CMP, FSH, TSH, lipid profile and vitamin D placed.  She will return in a month to have this done fasting.  She does have a history of low vitamin D is in the past.  Additional time in excess of her routine gynecologic  exam was spent in direct face to face counseling and coordination of care in regards to her menopausal symptoms.   Dara Lords MD, 12:23 PM 11/10/2017

## 2017-11-13 LAB — PAP IG AND HPV HIGH-RISK: HPV DNA High Risk: NOT DETECTED

## 2017-11-23 ENCOUNTER — Other Ambulatory Visit: Payer: Self-pay | Admitting: Gynecology

## 2017-11-24 ENCOUNTER — Telehealth: Payer: Self-pay | Admitting: Gynecology

## 2017-11-24 ENCOUNTER — Telehealth: Payer: Self-pay | Admitting: *Deleted

## 2017-11-24 NOTE — Telephone Encounter (Signed)
Last refilled on 10/12/17.

## 2017-11-24 NOTE — Telephone Encounter (Signed)
Pt was informed regarding Amerge Rx, declined referral to Neuro, states she has been bleeding since OV on 11/10/17 and has headaches daily, since stopping the HRT, pt would like to start back on HRT as she believes this is the cause of her headaches. Pt states she would like to speak with you personally. Please advise

## 2017-11-24 NOTE — Telephone Encounter (Signed)
Patient pharmacy request a refill for Rx Amerge. Per Dr. Audie BoxFontaine he will send a refill today and refer to Neurology. Patient given reply.  Patient refused a referral to Neurology. She stated the headaches are because he took me off HRT. She has had a period & headache daily since 11/10/2017. "I want to speak to Dr. Audie BoxFontaine."   Please advise.

## 2017-11-24 NOTE — Telephone Encounter (Signed)
Tell patient I am going to refill her Amerge now but she is using a lot of this and she needs to be seen by the neurology clinic for evaluation and their management i.e. [facility].  I will not keep refilling the Amerge at this frequent interval.  They may be able to get her on something that prevents her from having recurrent migraines as frequently.

## 2017-11-24 NOTE — Telephone Encounter (Signed)
Pt informed, see telephone encounter on 11/24/17.Rx sent

## 2017-11-25 NOTE — Telephone Encounter (Signed)
[facility].  Since she is off of them now asked her to come by and [facility] and then I will call her in follow-up and will see what this is and how she is doing.

## 2017-11-25 NOTE — Telephone Encounter (Signed)
Pt informed, states she will wait for her lab appointment on 12/08/17.

## 2017-12-01 ENCOUNTER — Other Ambulatory Visit: Payer: Self-pay | Admitting: Gynecology

## 2017-12-01 DIAGNOSIS — Z1231 Encounter for screening mammogram for malignant neoplasm of breast: Secondary | ICD-10-CM

## 2017-12-05 ENCOUNTER — Ambulatory Visit (HOSPITAL_BASED_OUTPATIENT_CLINIC_OR_DEPARTMENT_OTHER)
Admission: RE | Admit: 2017-12-05 | Discharge: 2017-12-05 | Disposition: A | Discharge status: Home / Self Care | Payer: BLUE CROSS/BLUE SHIELD | Source: Ambulatory Visit | Attending: Gynecology | Admitting: Gynecology

## 2017-12-05 DIAGNOSIS — Z1231 Encounter for screening mammogram for malignant neoplasm of breast: Secondary | ICD-10-CM | POA: Diagnosis not present

## 2017-12-08 ENCOUNTER — Other Ambulatory Visit: Payer: BLUE CROSS/BLUE SHIELD

## 2017-12-08 DIAGNOSIS — Z01419 Encounter for gynecological examination (general) (routine) without abnormal findings: Secondary | ICD-10-CM | POA: Diagnosis not present

## 2017-12-08 DIAGNOSIS — N951 Menopausal and female climacteric states: Secondary | ICD-10-CM

## 2017-12-08 DIAGNOSIS — E559 Vitamin D deficiency, unspecified: Secondary | ICD-10-CM

## 2017-12-08 DIAGNOSIS — Z13220 Encounter for screening for lipoid disorders: Secondary | ICD-10-CM

## 2017-12-09 ENCOUNTER — Other Ambulatory Visit: Payer: Self-pay | Admitting: Gynecology

## 2017-12-09 ENCOUNTER — Telehealth: Payer: Self-pay

## 2017-12-09 DIAGNOSIS — E559 Vitamin D deficiency, unspecified: Secondary | ICD-10-CM

## 2017-12-09 LAB — COMPREHENSIVE METABOLIC PANEL
AG Ratio: 1.8 (calc) (ref 1.0–2.5)
ALBUMIN MSPROF: 4.6 g/dL (ref 3.6–5.1)
ALKALINE PHOSPHATASE (APISO): 76 U/L (ref 33–130)
ALT: 14 U/L (ref 6–29)
AST: 14 U/L (ref 10–35)
BILIRUBIN TOTAL: 0.4 mg/dL (ref 0.2–1.2)
BUN: 13 mg/dL (ref 7–25)
CO2: 25 mmol/L (ref 20–32)
CREATININE: 0.84 mg/dL (ref 0.50–1.05)
Calcium: 9.7 mg/dL (ref 8.6–10.4)
Chloride: 104 mmol/L (ref 98–110)
Globulin: 2.6 g/dL (calc) (ref 1.9–3.7)
Glucose, Bld: 84 mg/dL (ref 65–99)
POTASSIUM: 4.2 mmol/L (ref 3.5–5.3)
Sodium: 140 mmol/L (ref 135–146)
Total Protein: 7.2 g/dL (ref 6.1–8.1)

## 2017-12-09 LAB — CBC WITH DIFFERENTIAL/PLATELET
BASOS PCT: 0.5 %
Basophils Absolute: 20 cells/uL (ref 0–200)
EOS PCT: 4.6 %
Eosinophils Absolute: 179 cells/uL (ref 15–500)
HEMATOCRIT: 40.3 % (ref 35.0–45.0)
Hemoglobin: 14.1 g/dL (ref 11.7–15.5)
LYMPHS ABS: 1264 {cells}/uL (ref 850–3900)
MCH: 29.1 pg (ref 27.0–33.0)
MCHC: 35 g/dL (ref 32.0–36.0)
MCV: 83.3 fL (ref 80.0–100.0)
MPV: 10.2 fL (ref 7.5–12.5)
Monocytes Relative: 7.1 %
NEUTROS PCT: 55.4 %
Neutro Abs: 2161 cells/uL (ref 1500–7800)
PLATELETS: 259 10*3/uL (ref 140–400)
RBC: 4.84 10*6/uL (ref 3.80–5.10)
RDW: 12.9 % (ref 11.0–15.0)
TOTAL LYMPHOCYTE: 32.4 %
WBC: 3.9 10*3/uL (ref 3.8–10.8)
WBCMIX: 277 {cells}/uL (ref 200–950)

## 2017-12-09 LAB — LIPID PANEL
CHOL/HDL RATIO: 2.8 (calc) (ref <5.0)
CHOLESTEROL: 196 mg/dL (ref <200)
HDL: 70 mg/dL (ref >50)
LDL CHOLESTEROL (CALC): 106 mg/dL — AB
NON-HDL CHOLESTEROL (CALC): 126 mg/dL (ref <130)
TRIGLYCERIDES: 104 mg/dL (ref <150)

## 2017-12-09 LAB — VITAMIN D 25 HYDROXY (VIT D DEFICIENCY, FRACTURES): Vit D, 25-Hydroxy: 28 ng/mL — ABNORMAL LOW (ref 30–100)

## 2017-12-09 LAB — TSH: TSH: 2.07 mIU/L

## 2017-12-09 LAB — FOLLICLE STIMULATING HORMONE: FSH: 73.2 m[IU]/mL

## 2017-12-09 MED ORDER — NARATRIPTAN HCL 2.5 MG PO TABS: ORAL_TABLET | ORAL | 0 refills | Status: DC

## 2017-12-09 MED ORDER — ESTRADIOL 0.1 MG/24HR TD PTTW: MEDICATED_PATCH | TRANSDERMAL | 3 refills | Status: DC

## 2017-12-09 MED ORDER — PROGESTERONE MICRONIZED 100 MG PO CAPS: 100.0000 mg | ORAL_CAPSULE | Freq: Every day | ORAL | 3 refills | Status: DC

## 2017-12-09 NOTE — Telephone Encounter (Signed)
I spoke with patient about result note. She is going to restart the Minivelle Patch and Prometrium hs.  She said to tell you that she had her mammogram as well.  She said to tell you she is starting HRT but you must refill her migraine Rx for her. I asked her did she discuss with you at visit. She said she did but when she requested a refill you told her she would need to see neurologist. She said you have refilled this for her at her yearly for years and she is not happy about you wanting her to see a neurologist and declining to refill it.  I told her I would relay that to you on her behalf.

## 2017-12-09 NOTE — Telephone Encounter (Signed)
Rx sent 

## 2017-12-09 NOTE — Telephone Encounter (Signed)
-----   Message from Dara Lordsimothy P Fontaine, MD sent at 12/09/2017  8:03 AM EDT ----- Tell patient: 1.  Vitamin D level low.  Recommend 2000 units daily of vitamin D and recheck her vitamin D level in 3-6 months. 2.  FSH clearly in the menopausal range at 73 (greater than 23 considered menopausal) I am assuming she probably is still having significant hot flushes and sweats.  If so options would be to restart her HRT.  If she is interested in doing so then we will go back on minivelle 0.1 patch and Prometrium 100 mg at bedtime. 3.  LDL minimally elevated but her great HDL makes up for this.  Total cholesterol is normal. If she is interested in discussing hormone replacement treatment options then office visit.

## 2017-12-09 NOTE — Telephone Encounter (Signed)
Okay to refill the Amerge x1 for now.

## 2018-02-04 ENCOUNTER — Other Ambulatory Visit: Payer: Self-pay | Admitting: Gynecology

## 2018-04-09 ENCOUNTER — Other Ambulatory Visit: Payer: Self-pay | Admitting: Gynecology

## 2018-06-17 DIAGNOSIS — M1812 Unilateral primary osteoarthritis of first carpometacarpal joint, left hand: Secondary | ICD-10-CM | POA: Diagnosis not present

## 2018-09-20 ENCOUNTER — Other Ambulatory Visit: Payer: Self-pay | Admitting: Gynecology

## 2018-11-11 ENCOUNTER — Ambulatory Visit: Payer: BLUE CROSS/BLUE SHIELD | Admitting: Gynecology

## 2018-11-11 ENCOUNTER — Encounter: Payer: Self-pay | Admitting: Gynecology

## 2018-11-11 VITALS — BP 154/90 | Ht 60.5 in | Wt 158.0 lb

## 2018-11-11 DIAGNOSIS — R635 Abnormal weight gain: Secondary | ICD-10-CM | POA: Diagnosis not present

## 2018-11-11 DIAGNOSIS — Z13220 Encounter for screening for lipoid disorders: Secondary | ICD-10-CM | POA: Diagnosis not present

## 2018-11-11 DIAGNOSIS — I10 Essential (primary) hypertension: Secondary | ICD-10-CM

## 2018-11-11 DIAGNOSIS — Z01419 Encounter for gynecological examination (general) (routine) without abnormal findings: Secondary | ICD-10-CM

## 2018-11-11 DIAGNOSIS — E559 Vitamin D deficiency, unspecified: Secondary | ICD-10-CM

## 2018-11-11 MED ORDER — ESTRADIOL 0.1 MG/24HR TD PTTW: MEDICATED_PATCH | TRANSDERMAL | 4 refills | Status: DC

## 2018-11-11 MED ORDER — NARATRIPTAN HCL 2.5 MG PO TABS: ORAL_TABLET | ORAL | 1 refills | Status: DC

## 2018-11-11 NOTE — Progress Notes (Signed)
    Theresa Bass July 20, 1966 [id number]        53 y.o.  [id number] for annual gynecologic exam.  Tried stopping HRT last year but had flare in her migraines and restarted her estradiol 0.1 mg patch and Prometrium 100 mg nightly.  She continues to have monthly menses.  Past medical history,surgical history, problem list, medications, allergies, family history and social history were all reviewed and documented as reviewed in the EPIC chart.  ROS:  Performed with pertinent positives and negatives included in the history, assessment and plan.   Additional significant findings : None   Exam: Kennon Portela assistant Vitals:   11/11/18 0846  BP: (!) 154/90  Weight: 158 lb (71.7 kg)  Height: 5' 0.5" (1.537 m)   Body mass index is 30.35 kg/m.  General appearance:  Normal affect, orientation and appearance. Skin: Grossly normal HEENT: Without gross lesions.  No cervical or supraclavicular adenopathy. Thyroid normal.  Lungs:  Clear without wheezing, rales or rhonchi Cardiac: RR, without RMG Abdominal:  Soft, nontender, without masses, guarding, rebound, organomegaly or hernia Breasts:  Examined lying and sitting without masses, retractions, discharge or axillary adenopathy. Pelvic:  Ext, BUS, Vagina: Normal  Cervix: Normal  Uterus: Anteverted, normal size, shape and contour, midline and mobile nontender   Adnexa: Without masses or tenderness    Anus and perineum: Normal   Rectovaginal: Normal sphincter tone without palpated masses or tenderness.    Assessment/Plan:  53 y.o. [id number] female for annual gynecologic exam.  With regular menses, tubal sterilization  1. Perimenopause.  Is on Vivelle 0.1 mg patch and Prometrium 100 mg nightly.  Continues to have monthly menses.  Had tried stopping last year but had flare in her migraines.  Classically has flares the week before her menses.  We again discussed the whole issue of HRT risks versus benefits to include the risk of thrombosis such as  stroke heart attack DVT in the breast cancer issue.  If she continues to have monthly menses of recommended for now that she continue on the estradiol patch and stop the Prometrium and see how she does.  If she continues with regular bleeding then will follow.  If skips or atypical bleeding she will call. 2. History of migraines.  Uses Amerge as needed.  Refill provided. 3. Mammography 2019.  Continue with annual mammography next month when due.  Breast exam normal today. 4. Colonoscopy never.  I again recommended screening colonoscopy.  Patient's husband is arranging for colonoscopy this year and she plans to follow-up after that with colonoscopy. 5. Pap smear/HPV 2019.  No Pap smear done today.  History of ASCUS 2012 with normal Pap smears since. 6. Health maintenance.  Future orders placed for CBC, CMP, TSH due to weight gain, lipid profile and vitamin D due to vitamin D deficiency.  Patient will return fasting.  Blood pressure discussed at 154/90.  She is gained some weight this year and is attempting weight loss now.  Need to monitor her blood pressure at home and follow-up with a primary physician if it remains elevated discussed.  Follow-up in 1 year, sooner as needed.   Dara Lords MD, 9:23 AM 11/11/2018

## 2018-11-11 NOTE — Patient Instructions (Signed)
Continue to monitor your blood pressure at home.  It should be in the 120/70 range.  If it remains elevated you will need to see a primary physician for management.  Follow-up for fasting blood work  Schedule your mammogram in March as this will be 1 year from your last mammogram.  Follow-up in 1 year for annual exam

## 2019-05-03 DIAGNOSIS — N951 Menopausal and female climacteric states: Secondary | ICD-10-CM | POA: Diagnosis not present

## 2019-05-03 DIAGNOSIS — Z1231 Encounter for screening mammogram for malignant neoplasm of breast: Secondary | ICD-10-CM | POA: Diagnosis not present

## 2019-05-03 DIAGNOSIS — Z6829 Body mass index (BMI) 29.0-29.9, adult: Secondary | ICD-10-CM | POA: Diagnosis not present

## 2019-05-03 DIAGNOSIS — Z01419 Encounter for gynecological examination (general) (routine) without abnormal findings: Secondary | ICD-10-CM | POA: Diagnosis not present

## 2019-06-22 ENCOUNTER — Encounter: Payer: Self-pay | Admitting: Gynecology

## 2019-08-29 DIAGNOSIS — M722 Plantar fascial fibromatosis: Secondary | ICD-10-CM | POA: Diagnosis not present

## 2020-01-01 NOTE — Patient Instructions (Addendum)
It was great to see you as a patient today, I will be in touch with your labs as soon as possible Please go to the lab, and then to the ground floor to have x-rays of your neck We will try prednisone for 10 days, this may relieve the numbness you are having in your left arm-please let me know how this works out for you  For blood pressure, we will start you on losartan 50 mg; you may certainly try 1/2 tablet first if you like I would like to get your blood pressure lower than 140/85  We will set you up for Cologuard-kit should be sent to your home
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
## 2020-01-02 ENCOUNTER — Other Ambulatory Visit: Payer: Self-pay

## 2020-01-02 ENCOUNTER — Encounter: Payer: Self-pay | Admitting: Family Medicine

## 2020-01-02 ENCOUNTER — Ambulatory Visit (INDEPENDENT_AMBULATORY_CARE_PROVIDER_SITE_OTHER): Payer: BC Managed Care – PPO | Admitting: Family Medicine

## 2020-01-02 ENCOUNTER — Ambulatory Visit (HOSPITAL_BASED_OUTPATIENT_CLINIC_OR_DEPARTMENT_OTHER)
Admission: RE | Admit: 2020-01-02 | Discharge: 2020-01-02 | Disposition: A | Discharge status: Home / Self Care | Payer: BC Managed Care – PPO | Source: Ambulatory Visit | Attending: Family Medicine | Admitting: Family Medicine

## 2020-01-02 VITALS — BP 156/83 | HR 73 | Temp 97.3°F | Resp 16 | Ht 60.5 in | Wt 153.0 lb

## 2020-01-02 DIAGNOSIS — M47812 Spondylosis without myelopathy or radiculopathy, cervical region: Secondary | ICD-10-CM | POA: Diagnosis not present

## 2020-01-02 DIAGNOSIS — R9431 Abnormal electrocardiogram [ECG] [EKG]: Secondary | ICD-10-CM

## 2020-01-02 DIAGNOSIS — Z131 Encounter for screening for diabetes mellitus: Secondary | ICD-10-CM | POA: Diagnosis not present

## 2020-01-02 DIAGNOSIS — R202 Paresthesia of skin: Secondary | ICD-10-CM | POA: Diagnosis not present

## 2020-01-02 DIAGNOSIS — Z130 Encounter for screening for diseases of the blood and blood-forming organs and certain disorders involving the immune mechanism: Secondary | ICD-10-CM | POA: Diagnosis not present

## 2020-01-02 DIAGNOSIS — Z13220 Encounter for screening for lipoid disorders: Secondary | ICD-10-CM | POA: Diagnosis not present

## 2020-01-02 DIAGNOSIS — Z1329 Encounter for screening for other suspected endocrine disorder: Secondary | ICD-10-CM | POA: Diagnosis not present

## 2020-01-02 DIAGNOSIS — I10 Essential (primary) hypertension: Secondary | ICD-10-CM | POA: Diagnosis not present

## 2020-01-02 MED ORDER — LOSARTAN POTASSIUM 50 MG PO TABS: 50.0000 mg | ORAL_TABLET | Freq: Every day | ORAL | 3 refills | Status: DC

## 2020-01-02 MED ORDER — PREDNISONE 20 MG PO TABS: ORAL_TABLET | ORAL | 0 refills | Status: DC

## 2020-01-03 ENCOUNTER — Encounter: Payer: Self-pay | Admitting: Family Medicine

## 2020-01-03 LAB — COMPREHENSIVE METABOLIC PANEL
ALT: 16 U/L (ref 0–35)
AST: 19 U/L (ref 0–37)
Albumin: 4.4 g/dL (ref 3.5–5.2)
Alkaline Phosphatase: 71 U/L (ref 39–117)
BUN: 13 mg/dL (ref 6–23)
CO2: 28 mEq/L (ref 19–32)
Calcium: 9.3 mg/dL (ref 8.4–10.5)
Chloride: 101 mEq/L (ref 96–112)
Creatinine, Ser: 1.01 mg/dL (ref 0.40–1.20)
GFR: 56.68 mL/min — ABNORMAL LOW (ref >60.00)
Glucose, Bld: 90 mg/dL (ref 70–99)
Potassium: 4 mEq/L (ref 3.5–5.1)
Sodium: 137 mEq/L (ref 135–145)
Total Bilirubin: 0.3 mg/dL (ref 0.2–1.2)
Total Protein: 7.1 g/dL (ref 6.0–8.3)

## 2020-01-03 LAB — LIPID PANEL
Cholesterol: 193 mg/dL (ref 0–200)
HDL: 68.4 mg/dL (ref >39.00)
LDL Cholesterol: 89 mg/dL (ref 0–99)
NonHDL: 125.04
Total CHOL/HDL Ratio: 3
Triglycerides: 182 mg/dL — ABNORMAL HIGH (ref 0.0–149.0)
VLDL: 36.4 mg/dL (ref 0.0–40.0)

## 2020-01-03 LAB — HEMOGLOBIN A1C: Hgb A1c MFr Bld: 5.4 % (ref 4.6–6.5)

## 2020-01-03 LAB — CBC
HCT: 39.7 % (ref 36.0–46.0)
Hemoglobin: 13.4 g/dL (ref 12.0–15.0)
MCHC: 33.8 g/dL (ref 30.0–36.0)
MCV: 84.8 fl (ref 78.0–100.0)
Platelets: 283 10*3/uL (ref 150.0–400.0)
RBC: 4.68 Mil/uL (ref 3.87–5.11)
RDW: 13.4 % (ref 11.5–15.5)
WBC: 5.8 10*3/uL (ref 4.0–10.5)

## 2020-01-03 LAB — TSH: TSH: 1.13 u[IU]/mL (ref 0.35–4.50)

## 2020-01-05 ENCOUNTER — Telehealth (HOSPITAL_COMMUNITY): Payer: Self-pay | Admitting: Family Medicine
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
## 2020-01-06 ENCOUNTER — Ambulatory Visit (HOSPITAL_BASED_OUTPATIENT_CLINIC_OR_DEPARTMENT_OTHER): Payer: BC Managed Care – PPO

## 2020-01-24 ENCOUNTER — Encounter: Payer: Self-pay | Admitting: Family Medicine

## 2020-01-25 ENCOUNTER — Encounter: Payer: Self-pay | Admitting: Family Medicine

## 2020-04-10 ENCOUNTER — Encounter: Payer: Self-pay | Admitting: Family Medicine

## 2020-04-11 ENCOUNTER — Encounter: Payer: Self-pay | Admitting: Family

## 2020-04-11 ENCOUNTER — Ambulatory Visit: Payer: BC Managed Care – PPO | Admitting: Family

## 2020-04-11 ENCOUNTER — Other Ambulatory Visit: Payer: Self-pay

## 2020-04-11 VITALS — BP 148/101 | HR 81 | Temp 98.2°F | Resp 16 | Ht 60.5 in | Wt 148.8 lb

## 2020-04-11 DIAGNOSIS — I10 Essential (primary) hypertension: Secondary | ICD-10-CM

## 2020-04-11 DIAGNOSIS — R1013 Epigastric pain: Secondary | ICD-10-CM

## 2020-04-11 LAB — CBC WITH DIFFERENTIAL/PLATELET
Basophils Absolute: 0 10*3/uL (ref 0.0–0.1)
Basophils Relative: 0.3 % (ref 0.0–3.0)
Eosinophils Absolute: 0.2 10*3/uL (ref 0.0–0.7)
Eosinophils Relative: 1.8 % (ref 0.0–5.0)
HCT: 39.8 % (ref 36.0–46.0)
Hemoglobin: 13.2 g/dL (ref 12.0–15.0)
Lymphocytes Relative: 18.7 % (ref 12.0–46.0)
Lymphs Abs: 1.5 10*3/uL (ref 0.7–4.0)
MCHC: 33.3 g/dL (ref 30.0–36.0)
MCV: 87.1 fl (ref 78.0–100.0)
Monocytes Absolute: 0.5 10*3/uL (ref 0.1–1.0)
Monocytes Relative: 5.9 % (ref 3.0–12.0)
Neutro Abs: 6.1 10*3/uL (ref 1.4–7.7)
Neutrophils Relative %: 73.3 % (ref 43.0–77.0)
Platelets: 277 10*3/uL (ref 150.0–400.0)
RBC: 4.57 Mil/uL (ref 3.87–5.11)
RDW: 13.4 % (ref 11.5–15.5)
WBC: 8.3 10*3/uL (ref 4.0–10.5)

## 2020-04-11 LAB — COMPREHENSIVE METABOLIC PANEL
ALT: 17 U/L (ref 0–35)
AST: 15 U/L (ref 0–37)
Albumin: 4.7 g/dL (ref 3.5–5.2)
Alkaline Phosphatase: 85 U/L (ref 39–117)
BUN: 9 mg/dL (ref 6–23)
CO2: 30 mEq/L (ref 19–32)
Calcium: 9.5 mg/dL (ref 8.4–10.5)
Chloride: 99 mEq/L (ref 96–112)
Creatinine, Ser: 0.82 mg/dL (ref 0.40–1.20)
GFR: 72.02 mL/min (ref >60.00)
Glucose, Bld: 93 mg/dL (ref 70–99)
Potassium: 3.7 mEq/L (ref 3.5–5.1)
Sodium: 136 mEq/L (ref 135–145)
Total Bilirubin: 0.5 mg/dL (ref 0.2–1.2)
Total Protein: 7.3 g/dL (ref 6.0–8.3)

## 2020-04-11 LAB — LIPASE: Lipase: 31 U/L (ref 11.0–59.0)

## 2020-04-11 MED ORDER — OMEPRAZOLE 40 MG PO CPDR: 40.0000 mg | DELAYED_RELEASE_CAPSULE | Freq: Every day | ORAL | 3 refills | Status: DC

## 2020-04-11 NOTE — Patient Instructions (Signed)
Please complete lab work prior to leaving. You should be contacted about scheduling your abdominal ultrasound to check your gallbladder. Go to the ER if you develop worsening pain, or if you are unable to keep down food or liquid.

## 2020-04-11 NOTE — Progress Notes (Signed)
Subjective:    Patient ID: Theresa Bass, female    DOB: 12-18-1965, 54 y.o.   MRN: [id number]  HPI  Patient is a 54 yr old female who presents today with chief complaint of epigastric pain.   Symptoms started on Monday night.  Was a bit better yesterday AM.  Then started back again around 11 AM.  Last BM was last night.  Denies nausea or vomiting,  but reports anorexia.  Reports 1/2 cup of coffee this am and a pop tart and gingerale. She has gallbladder.  She denies fever but felt "cold last night wth the shakes.  Feels better if she lays flat.  Denies NSAID use. Denies alcohol use.   Review of Systems Past Medical History:  Diagnosis Date  . Migraine      Social History   Socioeconomic History  . Marital status: Married    Spouse name: Not on file  . Number of children: Not on file  . Years of education: Not on file  . Highest education level: Not on file  Occupational History  . Not on file  Tobacco Use  . Smoking status: Never Smoker  . Smokeless tobacco: Never Used  Vaping Use  . Vaping Use: Never used  Substance and Sexual Activity  . Alcohol use: Yes    Alcohol/week: 0.0 standard drinks    Comment: Rare  . Drug use: No  . Sexual activity: Yes    Birth control/protection: Surgical    Comment: BTL-1st intercourse 54 yo-More than 5 partners  Other Topics Concern  . Not on file  Social History Narrative  . Not on file   Social Determinants of Health   Financial Resource Strain:   . Difficulty of Paying Living Expenses:   Food Insecurity:   . Worried About Programme researcher, broadcasting/film/video in the Last Year:   . Barista in the Last Year:   Transportation Needs:   . Freight forwarder (Medical):   Marland Kitchen Lack of Transportation (Non-Medical):   Physical Activity:   . Days of Exercise per Week:   . Minutes of Exercise per Session:   Stress:   . Feeling of Stress :   Social Connections:   . Frequency of Communication with Friends and Family:   . Frequency of  Social Gatherings with Friends and Family:   . Attends Religious Services:   . Active Member of Clubs or Organizations:   . Attends Banker Meetings:   Marland Kitchen Marital Status:   Intimate Partner Violence:   . Fear of Current or Ex-Partner:   . Emotionally Abused:   Marland Kitchen Physically Abused:   . Sexually Abused:     Past Surgical History:  Procedure Laterality Date  . CESAREAN SECTION     x 4  . ELBOW SURGERY    . HYSTEROSCOPY    . PELVIC LAPAROSCOPY    . TUBAL LIGATION      Family History  Problem Relation Age of Onset  . Hypertension Mother   . Hypertension Father   . Hypertension Brother   . Hypertension Sister     No Known Allergies  Current Outpatient Medications on File Prior to Visit  Medication Sig Dispense Refill  . estradiol (VIVELLE-DOT) 0.1 MG/24HR patch APPLY 1 PATCH TWICE WEEKLY. 24 patch 4  . losartan (COZAAR) 50 MG tablet Take 1 tablet (50 mg total) by mouth daily. 30 tablet 3  . naratriptan (AMERGE) 2.5 MG tablet Take one (1) tablet at onset  of headache; if returns or does not resolve, may repeat after 4 hours; do not exceed five (5) mg in 24 hours. 9 tablet 1  . progesterone (PROMETRIUM) 100 MG capsule Take 1 capsule (100 mg total) by mouth at bedtime. 90 capsule 3  . VITAMIN D PO Take by mouth.     No current facility-administered medications on file prior to visit.    BP (!) 148/101 (BP Location: Right Arm, Patient Position: Sitting, Cuff Size: Small)   Pulse 81   Temp 98.2 F (36.8 C) (Oral)   Resp 16   Ht 5' 0.5" (1.537 m)   Wt 148 lb 12.8 oz (67.5 kg)   LMP 11/02/2016   SpO2 100%   BMI 28.58 kg/m       Objective:   Physical Exam Constitutional:      Appearance: She is well-developed.  Neck:     Thyroid: No thyromegaly.  Cardiovascular:     Rate and Rhythm: Normal rate and regular rhythm.     Heart sounds: Normal heart sounds. No murmur heard.   Pulmonary:     Effort: Pulmonary effort is normal. No respiratory distress.      Breath sounds: Normal breath sounds. No wheezing.  Abdominal:     General: Bowel sounds are increased. There is no distension.     Palpations: Abdomen is soft. There is no mass.     Tenderness: There is abdominal tenderness in the right upper quadrant and epigastric area. There is no guarding or rebound. Negative signs include Murphy's sign.  Musculoskeletal:     Cervical back: Neck supple.  Skin:    General: Skin is warm and dry.  Neurological:     Mental Status: She is alert and oriented to person, place, and time.  Psychiatric:        Behavior: Behavior normal.        Thought Content: Thought content normal.        Judgment: Judgment normal.           Assessment & Plan:  Epigastric pain- differential includes: gastroenteritis, pancreatitis, cholecystitis, PUD/H. Pylori. Pt was given a GI cocktail (Maalox/lidocaine). She noted slight improvement in her symptoms following dosing.  Will obtain CMET, CBC, Lipase, and H pylori. Obtain abdominal US to assess gallbladder. Begin trial of omeprazole once daily.   EKG tracing is personally reviewed and compared to EKG performed 01/02/20.  EKG notes NSR.  No acute changes and appears unchanged since previous EKG.  Pt is advised as follows:  Please complete lab work prior to leaving. You should be contacted about scheduling your abdominal ultrasound to check your gallbladder. Begin omeprazole once daily. Go to the ER if you develop worsening pain, or if you are unable to keep down food or liquid.  Follow up with PCP in 1 week.   HTN- bp elevated- pt was anxious today and having discomfort. Continue losartan. Repeat bp at her 1 week follow up.   This visit occurred during the SARS-CoV-2 public health emergency.  Safety protocols were in place, including screening questions prior to the visit, additional usage of staff PPE, and extensive cleaning of exam room while observing appropriate contact time as indicated for disinfecting solutions.     Addendum- lab cannot obtain H pylori breath test since she had maalox dose- must wait 2 weeks.  Since I am having her add PPI, will hold off on H pylori testing for now.  Could consider GI referral at a later date if symptoms  persist.

## 2020-04-11 NOTE — Telephone Encounter (Signed)
Pt seen by [facility] today.

## 2020-04-17 ENCOUNTER — Ambulatory Visit (HOSPITAL_BASED_OUTPATIENT_CLINIC_OR_DEPARTMENT_OTHER)
Admission: RE | Admit: 2020-04-17 | Discharge: 2020-04-17 | Disposition: A | Discharge status: Home / Self Care | Payer: BC Managed Care – PPO | Source: Ambulatory Visit | Attending: Family | Admitting: Family

## 2020-04-17 ENCOUNTER — Other Ambulatory Visit: Payer: Self-pay

## 2020-04-17 DIAGNOSIS — N281 Cyst of kidney, acquired: Secondary | ICD-10-CM | POA: Diagnosis not present

## 2020-04-17 DIAGNOSIS — K8020 Calculus of gallbladder without cholecystitis without obstruction: Secondary | ICD-10-CM | POA: Diagnosis not present

## 2020-04-17 DIAGNOSIS — R1013 Epigastric pain: Secondary | ICD-10-CM | POA: Diagnosis not present

## 2020-04-17 DIAGNOSIS — D734 Cyst of spleen: Secondary | ICD-10-CM | POA: Diagnosis not present

## 2020-04-17 DIAGNOSIS — D7389 Other diseases of spleen: Secondary | ICD-10-CM | POA: Diagnosis not present

## 2020-04-17 NOTE — Progress Notes (Signed)
[facility] [address] [place], [place] [address] [phone]  Date:  04/18/2020   Name:  Theresa Bass   DOB:  [birth year]   MRN:  [id number]  PCP:  Darreld Mclean, MD    Chief Complaint: Abdominal Pain (follow up, omeprazole prescribed but has not taken yet)   History of Present Illness:  Theresa Bass is a 54 y.o. very pleasant female patient who presents with the following:  Generally healthy woman with history of migraine headache Seen by our nurse practitioner Lenna Sciara on July 14 with concern of epigastric pain-she was given a GI cocktail with some improvement, EKG normal, labs reassuring.  She was prescribed omeprazole 40 Here today for short-term follow-up The pain is not as bad as it was but is not gone yet-continues to be in the epigastric area Eating makes it perhaps a bit better-  She has deceased her acidic foods- stopped cranberry juice which has helped at least somewhat  She does not notice episodic pain worse after heavy or fatty meal Her omeprazole just now arrived, she has not yet started taking it  Colon cancer screening- will re-order cologuard for her, she did not receive kit we previously ordered Mammogram- she will schedule soon Covid and Shingrix complete  US Abdomen Complete  Result Date: 04/17/2020 CLINICAL DATA:  54 year old female with epigastric pain. Rule out acute cholecystitis. EXAM: ABDOMEN ULTRASOUND COMPLETE COMPARISON:  None. FINDINGS: Gallbladder: There are multiple stones within the gallbladder measuring up to 2.4 cm. No gallbladder wall thickening or pericholecystic fluid. Negative sonographic Murphy's sign. Common bile duct: Diameter: 5 mm Liver: No focal lesion identified. Within normal limits in parenchymal echogenicity. Portal vein is patent on color Doppler imaging with normal direction of blood flow towards the liver. IVC: No abnormality visualized. Pancreas: Visualized  portion unremarkable. Spleen: Size and appearance within normal limits. There is a 10 x 10 x 12 mm splenic cyst. Right Kidney: Length: 8.8 cm. Echogenicity within normal limits. No mass or hydronephrosis visualized. Left Kidney: Length: 8.8 cm. Normal echogenicity. No hydronephrosis. There is a 6 mm echogenic focus in the medial interpolar kidney which may represent a focus of calcification or a nonobstructing stone versus a fat containing lesion such as an angiomyolipoma. Two small cysts with the larger measuring up to 2 cm. Abdominal aorta: No aneurysm visualized. Other findings: None. IMPRESSION: 1. Cholelithiasis without sonographic evidence of acute cholecystitis. 2. A small echogenic focus in the medial interpolar left kidney may represent a nonobstructing stone, a focus of parenchymal calcification, or a fat containing lesion such as angiomyolipoma. 3. Left renal and splenic cysts. Electronically Signed   By: Anner Crete M.D.   On: 04/17/2020 23:44    Patient Active Problem List   Diagnosis Date Noted  . Migraine     Past Medical History:  Diagnosis Date  . Migraine     Past Surgical History:  Procedure Laterality Date  . CESAREAN SECTION     x 4  . ELBOW SURGERY    . HYSTEROSCOPY    . PELVIC LAPAROSCOPY    . TUBAL LIGATION      Social History   Tobacco Use  . Smoking status: Never Smoker  . Smokeless tobacco: Never Used  Vaping Use  . Vaping Use: Never used  Substance Use Topics  . Alcohol use: Yes    Alcohol/week: 0.0 standard drinks    Comment: Rare  . Drug use: No  Family History  Problem Relation Age of Onset  . Hypertension Mother   . Hypertension Father   . Hypertension Brother   . Hypertension Sister     No Known Allergies  Medication list has been reviewed and updated.  Current Outpatient Medications on File Prior to Visit  Medication Sig Dispense Refill  . estradiol (VIVELLE-DOT) 0.1 MG/24HR patch APPLY 1 PATCH TWICE WEEKLY. 24 patch 4  .  losartan (COZAAR) 50 MG tablet Take 1 tablet (50 mg total) by mouth daily. 30 tablet 3  . naratriptan (AMERGE) 2.5 MG tablet Take one (1) tablet at onset of headache; if returns or does not resolve, may repeat after 4 hours; do not exceed five (5) mg in 24 hours. 9 tablet 1  . progesterone (PROMETRIUM) 100 MG capsule Take 1 capsule (100 mg total) by mouth at bedtime. 90 capsule 3  . VITAMIN D PO Take by mouth.    Marland Kitchen omeprazole (PRILOSEC) 40 MG capsule Take 1 capsule (40 mg total) by mouth daily. (Patient not taking: Reported on 04/18/2020) 30 capsule 3   No current facility-administered medications on file prior to visit.    Review of Systems:  As per HPI- otherwise negative.   Physical Examination: Vitals:   04/18/20 1606  BP: 128/80  Pulse: 82  Resp: 17  SpO2: 98%   Vitals:   04/18/20 1606  Weight: 149 lb (67.6 kg)  Height: 5' 0.5" (1.537 m)   Body mass index is 28.62 kg/m. Ideal Body Weight: Weight in (lb) to have BMI = 25: 129.9  GEN: no acute distress.  Mild overweight, looks well HEENT: Atraumatic, Normocephalic.  Ears and Nose: No external deformity. CV: RRR, No M/G/R. No JVD. No thrill. No extra heart sounds. PULM: CTA B, no wheezes, crackles, rhonchi. No retractions. No resp. distress. No accessory muscle use. ABD: S,  ND, +BS. No rebound. No HSM.  She has epigastric tenderness, no guarding.  No right upper quadrant tenderness and negative Murphy sign EXTR: No c/c/e PSYCH: Normally interactive. Conversant.   We went over her recent gallbladder ultrasound report   Assessment and Plan: Epigastric pain - Plan: H. pylori breath test  Calculus of gallbladder without cholecystitis without obstruction  Patient here today with recent onset of epigastric pain.  Based on exam and history I suspect gastritis and GERD is more likely than gallbladder colic, though she does have cholelithiasis.  Obtain H. pylori breath test today, she will start taking her PPI.  I referred  her to general surgery to discuss her gallbladder and possible elective cholecystectomy.  She is advised to seek immediate care if her symptoms worsen significantly, she will contact me if any concerns Reordered Cologuard for her today This visit occurred during the SARS-CoV-2 public health emergency.  Safety protocols were in place, including screening questions prior to the visit, additional usage of staff PPE, and extensive cleaning of exam room while observing appropriate contact time as indicated for disinfecting solutions.    Signed Lamar Blinks, MD

## 2020-04-17 NOTE — Patient Instructions (Addendum)
It was good to see you again today- we will do an H pylori for you and if you are positive treat you for this.  Go ahead and start on the omeprazole as well  It is not certain if your symptoms are due to gallbladder or your stomach, but I do suspect your stomach. Hopefully we will get you feeling better with the omeprazole.  However, I would also suggest that we have you see general surgery at your convenience for a consultation- if symptoms fail to improve you may need to consider having your gallbladder out electively  If any severe pain, esp with fever or vomiting- go to the ER!   We will re-order cologuard for you to screen for colon cancer at home Please schedule your mammo soon

## 2020-04-18 ENCOUNTER — Encounter: Payer: Self-pay | Admitting: Family Medicine

## 2020-04-18 ENCOUNTER — Ambulatory Visit: Payer: BC Managed Care – PPO | Admitting: Family Medicine

## 2020-04-18 VITALS — BP 128/80 | HR 82 | Resp 17 | Ht 60.5 in | Wt 149.0 lb

## 2020-04-18 DIAGNOSIS — K8020 Calculus of gallbladder without cholecystitis without obstruction: Secondary | ICD-10-CM | POA: Diagnosis not present

## 2020-04-18 DIAGNOSIS — R1013 Epigastric pain: Secondary | ICD-10-CM

## 2020-04-18 NOTE — Progress Notes (Signed)
Cologuard ordered for patient.  

## 2020-04-19 ENCOUNTER — Encounter: Payer: Self-pay | Admitting: Family Medicine

## 2020-04-19 LAB — H. PYLORI BREATH TEST: H. pylori Breath Test: NOT DETECTED

## 2020-05-03 ENCOUNTER — Other Ambulatory Visit: Payer: Self-pay | Admitting: Family Medicine

## 2020-05-03 DIAGNOSIS — I10 Essential (primary) hypertension: Secondary | ICD-10-CM

## 2020-05-08 DIAGNOSIS — Z1211 Encounter for screening for malignant neoplasm of colon: Secondary | ICD-10-CM | POA: Diagnosis not present

## 2020-05-08 LAB — COLOGUARD: Cologuard: NEGATIVE

## 2020-05-11 DIAGNOSIS — Z20828 Contact with and (suspected) exposure to other viral communicable diseases: Secondary | ICD-10-CM | POA: Diagnosis not present

## 2020-05-14 LAB — EXTERNAL GENERIC LAB PROCEDURE: COLOGUARD: NEGATIVE

## 2020-05-14 LAB — COLOGUARD: COLOGUARD: NEGATIVE

## 2020-05-16 ENCOUNTER — Encounter: Payer: Self-pay | Admitting: Family Medicine

## 2020-05-22 DIAGNOSIS — Z01419 Encounter for gynecological examination (general) (routine) without abnormal findings: Secondary | ICD-10-CM | POA: Diagnosis not present

## 2020-05-22 DIAGNOSIS — N950 Postmenopausal bleeding: Secondary | ICD-10-CM | POA: Diagnosis not present

## 2020-05-22 DIAGNOSIS — Z1231 Encounter for screening mammogram for malignant neoplasm of breast: Secondary | ICD-10-CM | POA: Diagnosis not present

## 2020-05-22 DIAGNOSIS — N951 Menopausal and female climacteric states: Secondary | ICD-10-CM | POA: Diagnosis not present

## 2020-06-05 ENCOUNTER — Other Ambulatory Visit: Payer: Self-pay | Admitting: Family Medicine

## 2020-06-05 DIAGNOSIS — I10 Essential (primary) hypertension: Secondary | ICD-10-CM

## 2020-06-18 DIAGNOSIS — N950 Postmenopausal bleeding: Secondary | ICD-10-CM | POA: Diagnosis not present

## 2020-07-04 ENCOUNTER — Other Ambulatory Visit: Payer: Self-pay | Admitting: Family Medicine

## 2020-07-04 DIAGNOSIS — I10 Essential (primary) hypertension: Secondary | ICD-10-CM

## 2020-07-18 ENCOUNTER — Other Ambulatory Visit (HOSPITAL_BASED_OUTPATIENT_CLINIC_OR_DEPARTMENT_OTHER): Payer: Self-pay | Admitting: Family Medicine

## 2020-07-18 DIAGNOSIS — Z1231 Encounter for screening mammogram for malignant neoplasm of breast: Secondary | ICD-10-CM

## 2020-07-24 ENCOUNTER — Encounter (HOSPITAL_BASED_OUTPATIENT_CLINIC_OR_DEPARTMENT_OTHER): Payer: Self-pay

## 2020-07-24 ENCOUNTER — Other Ambulatory Visit: Payer: Self-pay

## 2020-07-24 ENCOUNTER — Ambulatory Visit (HOSPITAL_BASED_OUTPATIENT_CLINIC_OR_DEPARTMENT_OTHER)
Admission: RE | Admit: 2020-07-24 | Discharge: 2020-07-24 | Disposition: A | Discharge status: Home / Self Care | Payer: BC Managed Care – PPO | Source: Ambulatory Visit | Attending: Family Medicine | Admitting: Family Medicine

## 2020-07-24 DIAGNOSIS — Z1231 Encounter for screening mammogram for malignant neoplasm of breast: Secondary | ICD-10-CM | POA: Diagnosis not present

## 2020-07-26 ENCOUNTER — Other Ambulatory Visit: Payer: Self-pay | Admitting: Family Medicine

## 2020-07-26 DIAGNOSIS — R928 Other abnormal and inconclusive findings on diagnostic imaging of breast: Secondary | ICD-10-CM

## 2020-08-03 ENCOUNTER — Other Ambulatory Visit: Payer: Self-pay

## 2020-08-03 DIAGNOSIS — I10 Essential (primary) hypertension: Secondary | ICD-10-CM

## 2020-08-03 MED ORDER — LOSARTAN POTASSIUM 50 MG PO TABS: 50.0000 mg | ORAL_TABLET | Freq: Every day | ORAL | 0 refills | Status: DC

## 2020-08-15 ENCOUNTER — Other Ambulatory Visit: Payer: BC Managed Care – PPO

## 2020-08-27 ENCOUNTER — Other Ambulatory Visit: Payer: BC Managed Care – PPO

## 2020-08-28 ENCOUNTER — Ambulatory Visit
Admission: RE | Admit: 2020-08-28 | Discharge: 2020-08-28 | Disposition: A | Discharge status: Home / Self Care | Payer: BC Managed Care – PPO | Source: Ambulatory Visit | Attending: Family Medicine | Admitting: Family Medicine

## 2020-08-28 ENCOUNTER — Other Ambulatory Visit: Payer: Self-pay | Admitting: Family Medicine

## 2020-08-28 ENCOUNTER — Other Ambulatory Visit: Payer: Self-pay

## 2020-08-28 DIAGNOSIS — R928 Other abnormal and inconclusive findings on diagnostic imaging of breast: Secondary | ICD-10-CM

## 2020-08-28 DIAGNOSIS — N6489 Other specified disorders of breast: Secondary | ICD-10-CM

## 2020-08-28 DIAGNOSIS — R922 Inconclusive mammogram: Secondary | ICD-10-CM | POA: Diagnosis not present

## 2020-10-05 DIAGNOSIS — U071 COVID-19: Secondary | ICD-10-CM | POA: Diagnosis not present

## 2020-10-09 ENCOUNTER — Encounter: Payer: Self-pay | Admitting: Family Medicine

## 2020-11-01 ENCOUNTER — Other Ambulatory Visit: Payer: Self-pay | Admitting: Family Medicine

## 2020-11-01 DIAGNOSIS — I10 Essential (primary) hypertension: Secondary | ICD-10-CM

## 2020-11-06 DIAGNOSIS — N950 Postmenopausal bleeding: Secondary | ICD-10-CM | POA: Diagnosis not present

## 2020-11-06 DIAGNOSIS — G43829 Menstrual migraine, not intractable, without status migrainosus: Secondary | ICD-10-CM | POA: Diagnosis not present

## 2020-11-06 DIAGNOSIS — N951 Menopausal and female climacteric states: Secondary | ICD-10-CM | POA: Diagnosis not present

## 2020-12-14 ENCOUNTER — Encounter: Payer: Self-pay | Admitting: Family Medicine

## 2020-12-14 DIAGNOSIS — R9431 Abnormal electrocardiogram [ECG] [EKG]: Secondary | ICD-10-CM

## 2020-12-28 ENCOUNTER — Ambulatory Visit: Payer: Self-pay | Admitting: Cardiology

## 2021-01-01 ENCOUNTER — Ambulatory Visit: Payer: Self-pay | Admitting: Cardiology

## 2021-01-02 ENCOUNTER — Encounter: Payer: Self-pay | Admitting: Cardiology

## 2021-01-02 ENCOUNTER — Ambulatory Visit: Payer: BC Managed Care – PPO | Admitting: Cardiology

## 2021-01-02 ENCOUNTER — Encounter: Payer: Self-pay | Admitting: Family Medicine

## 2021-01-02 ENCOUNTER — Other Ambulatory Visit: Payer: Self-pay

## 2021-01-02 VITALS — BP 143/86 | HR 80 | Resp 16 | Ht 60.0 in | Wt 155.6 lb

## 2021-01-02 DIAGNOSIS — Z8249 Family history of ischemic heart disease and other diseases of the circulatory system: Secondary | ICD-10-CM

## 2021-01-02 DIAGNOSIS — I10 Essential (primary) hypertension: Secondary | ICD-10-CM | POA: Diagnosis not present

## 2021-01-02 DIAGNOSIS — I209 Angina pectoris, unspecified: Secondary | ICD-10-CM

## 2021-01-02 DIAGNOSIS — R9431 Abnormal electrocardiogram [ECG] [EKG]: Secondary | ICD-10-CM

## 2021-01-02 MED ORDER — NITROGLYCERIN 0.4 MG SL SUBL
0.4000 mg | SUBLINGUAL_TABLET | SUBLINGUAL | 0 refills | Status: DC | PRN
Start: 2021-01-02 — End: 2021-12-25

## 2021-01-02 MED ORDER — ASPIRIN EC 81 MG PO TBEC: 81.0000 mg | DELAYED_RELEASE_TABLET | Freq: Every day | ORAL | 0 refills | Status: AC

## 2021-01-02 MED ORDER — METOPROLOL SUCCINATE ER 25 MG PO TB24
25.0000 mg | ORAL_TABLET | Freq: Every day | ORAL | 0 refills | Status: DC
Start: 2021-01-02 — End: 2021-01-30

## 2021-01-02 MED ORDER — ATORVASTATIN CALCIUM 40 MG PO TABS
40.0000 mg | ORAL_TABLET | Freq: Every day | ORAL | 0 refills | Status: DC
Start: 2021-01-02 — End: 2021-01-07

## 2021-01-02 NOTE — Progress Notes (Signed)
Date:  01/02/2021   ID:  Cindra Presume, DOB 09/02/1966, MRN [id number]  PCP:  Pearline Cables, MD  Cardiologist:  Tessa Lerner, DO, [facility] (established care 01/02/2021)  REASON FOR CONSULT: Abnormal EKG  REQUESTING PHYSICIAN:  Copland, Gwenlyn Found, MD [address] [place],  [place] [address]  Chief Complaint  Patient presents with  . Abnormal ECG  . New Patient (Initial Visit)    HPI  Theresa Bass is a 55 y.o. female who presents to the office with a chief complaint of "abnormal EKG and heart burn like symptoms." Patient's past medical history and cardiovascular risk factors include: Postmenopausal female, hypertension, on hormone replacement therapy, family history of premature coronary artery disease.  She is referred to the office at the request of Copland, Gwenlyn Found, MD for evaluation of abnormal ECG.  Chest discomfort: Patient states that for the past 1 month she has been having substernal discomfort which she describes as a burning/pressure-like sensation.  The intensity is 5 out of 10, at times radiates to the upper throat with a sensation of something being stuck.  Patient is active throughout the day but when she takes her dog walking at night she experiences chest discomfort with effort related activities.  Patient states by the time she gets up to a hill the discomfort is worse and usually decreases her pace and within 15 minutes the discomfort improves.  Patient states that the discomfort is also worse with colder weather.   Initially she thought it could be heartburn related as she does not eat a big meal at dinner and given the burning-like symptoms sensation in the upper chest and throat.  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  Patient's blood pressure at today's office visit is not at goal.  However, at home she states that the systolic blood pressures are usually around  130 mmHg.  Family history of premature coronary artery disease: Mom at the age of 22 had 2 PCI's performed due to an abnormal stress test.  FUNCTIONAL STATUS: She walks at least 25 minutes twice a day and lives a very active lifestyle.  ALLERGIES: No Known Allergies  MEDICATION LIST PRIOR TO VISIT: Current Meds  Medication Sig  . aspirin EC 81 MG tablet Take 1 tablet (81 mg total) by mouth daily. Swallow whole.  Marland Kitchen atorvastatin (LIPITOR) 40 MG tablet Take 1 tablet (40 mg total) by mouth at bedtime.  Marland Kitchen estradiol (VIVELLE-DOT) 0.1 MG/24HR patch APPLY 1 PATCH TWICE WEEKLY.  Marland Kitchen losartan (COZAAR) 50 MG tablet TAKE 1 TABLET BY MOUTH DAILY.  . metoprolol succinate (TOPROL XL) 25 MG 24 hr tablet Take 1 tablet (25 mg total) by mouth daily.  . naratriptan (AMERGE) 2.5 MG tablet Take one (1) tablet at onset of headache; if returns or does not resolve, may repeat after 4 hours; do not exceed five (5) mg in 24 hours.  . nitroGLYCERIN (NITROSTAT) 0.4 MG SL tablet Place 1 tablet (0.4 mg total) under the tongue every 5 (five) minutes as needed for chest pain. If you require more than two tablets five minutes apart go to the nearest ER via EMS.  . progesterone (PROMETRIUM) 100 MG capsule Take 1 capsule (100 mg total) by mouth at bedtime.  Marland Kitchen VITAMIN D PO Take by mouth.     PAST MEDICAL HISTORY: Past Medical History:  Diagnosis Date  . Hypertension   . Migraine  PAST SURGICAL HISTORY: Past Surgical History:  Procedure Laterality Date  . CESAREAN SECTION     x 4  . ELBOW SURGERY    . HYSTEROSCOPY    . PELVIC LAPAROSCOPY    . TUBAL LIGATION      FAMILY HISTORY: The patient family history includes Heart disease in her mother; Heart failure in her father; Hypertension in her brother, father, mother, and sister.  SOCIAL HISTORY:  The patient  reports that she has never smoked. She has never used smokeless tobacco. She reports previous alcohol use. She reports that she does not use  drugs.  REVIEW OF SYSTEMS: Review of Systems  Constitutional: Negative for chills and fever.  HENT: Negative for hoarse voice and nosebleeds.   Eyes: Negative for discharge, double vision and pain.  Cardiovascular: Positive for chest pain and dyspnea on exertion. Negative for claudication, leg swelling, near-syncope, orthopnea, palpitations, paroxysmal nocturnal dyspnea and syncope.  Respiratory: Negative for hemoptysis and shortness of breath.   Musculoskeletal: Negative for muscle cramps and myalgias.  Gastrointestinal: Positive for heartburn. Negative for abdominal pain, constipation, diarrhea, hematemesis, hematochezia, melena, nausea and vomiting.  Neurological: Negative for dizziness and light-headedness.   PHYSICAL EXAM: Vitals with BMI 01/02/2021 04/18/2020 04/11/2020  Height 5\' 0"  5' 0.5" 5' 0.5"  Weight 155 lbs 10 oz 149 lbs 148 lbs 13 oz  BMI 30.39 28.61 28.57  Systolic 143 128  Diastolic 86 80 101  Pulse 80 82 81    CONSTITUTIONAL: Well-developed and well-nourished. No acute distress.  SKIN: Skin is warm and dry. No rash noted. No cyanosis. No pallor. No jaundice HEAD: Normocephalic and atraumatic.  EYES: No scleral icterus MOUTH/THROAT: Moist oral membranes.  NECK: No JVD present. No thyromegaly noted. No carotid bruits  LYMPHATIC: No visible cervical adenopathy.  CHEST Normal respiratory effort. No intercostal retractions  LUNGS: Clear to auscultation bilaterally. No stridor. No wheezes. No rales.  CARDIOVASCULAR: Regular rate and rhythm, positive S1-S2, no murmurs rubs or gallops appreciated. ABDOMINAL: No apparent ascites.  EXTREMITIES: No peripheral edema  HEMATOLOGIC: No significant bruising NEUROLOGIC: Oriented to person, place, and time. Nonfocal. Normal muscle tone.  PSYCHIATRIC: Normal mood and affect. Normal behavior. Cooperative  CARDIAC DATABASE: EKG: 01/02/2021: Normal sinus rhythm, 71 bpm, consider old anteroseptal infarct, T wave inversions in leads  V1-V4 possible anteroseptal/anterior ischemia. Without underlying injury pattern.  Echocardiogram: No results found for this or any previous visit from the past 1095 days.  Stress Testing: No results found for this or any previous visit from the past 1095 days.  Heart Catheterization: None  LABORATORY DATA: CBC Latest Ref Rng & Units 04/11/2020 01/02/2020 12/08/2017  WBC 4.0 - 10.5 K/uL 8.3 5.8 3.9  Hemoglobin 12.0 - 15.0 g/dL 02/07/2018 97.6 73.4  Hematocrit 36.0 - 46.0 % 39.8 39.7 40.3  Platelets 150.0 - 400.0 K/uL 277.0 283.0 259    CMP Latest Ref Rng & Units 04/11/2020 01/02/2020 12/08/2017  Glucose 70 - 99 mg/dL 93 90 84  BUN 6 - 23 mg/dL 9 13 13   Creatinine 0.40 - 1.20 mg/dL 02/07/2018 7.90  Sodium 135 - 145 mEq/L 136 137 140  Potassium 3.5 - 5.1 mEq/L 3.7 4.0 4.2  Chloride 96 - 112 mEq/L 99 101 104  CO2 19 - 32 mEq/L 30 28 25   Calcium 8.4 - 10.5 mg/dL 9.5 9.3 9.7  Total Protein 6.0 - 8.3 g/dL 7.3 7.1 7.2  Total Bilirubin 0.2 - 1.2 mg/dL 0.5 0.3 0.4  Alkaline Phos 39 - 117 U/L 85  71 -  AST 0 - 37 U/L 15 19 14   ALT 0 - 35 U/L 17 16 14     Lipid Panel  Lab Results  Component Value Date   CHOL 193 01/02/2020   HDL 68.40 01/02/2020   LDLCALC 89 01/02/2020   TRIG 182.0 (H) 01/02/2020   CHOLHDL 3 01/02/2020   No components found for: NTPROBNP No results for input(s): PROBNP in the last 8760 hours. No results for input(s): TSH in the last 8760 hours.  BMP Recent Labs    04/11/20 1214  NA 136  K 3.7  CL 99  CO2 30  GLUCOSE 93  BUN 9  CREATININE 0.82  CALCIUM 9.5    HEMOGLOBIN A1C Lab Results  Component Value Date   HGBA1C 5.4 01/02/2020    IMPRESSION:    ICD-10-CM   1. Angina pectoris (HCC)  I20.9 nitroGLYCERIN (NITROSTAT) 0.4 MG SL tablet    aspirin EC 81 MG tablet    atorvastatin (LIPITOR) 40 MG tablet    metoprolol succinate (TOPROL XL) 25 MG 24 hr tablet    Basic metabolic panel    Magnesium    Lipid Panel With LDL/HDL Ratio    LDL cholesterol, direct     CBC    SARS-COV-2 RNA,(COVID-19) QUAL NAAT  2. Abnormal EKG  R94.31 EKG 12-Lead  3. Benign hypertension  I10      RECOMMENDATIONS: Mersadies Petree is a 55 y.o. female whose past medical history and cardiac risk factors include: Postmenopausal female, hypertension, on hormone replacement therapy, family history of premature coronary artery disease.  Angina Pectoris:  Patient symptoms are concerning for cardiac chest pain as the discomfort is substernally located worse with effort related activities and cold weather exposure and improves with rest. Discussed undergoing either left heart catheterization with possible PCI versus coronary CTA. Patient chooses to proceed with left heart catheterization with possible intervention. Start aspirin 81 mg p.o. daily. Start statin therapy. Start Toprol-XL 25 mg p.o. daily. Sublingual nitroglycerin tablets to use on as needed basis. Currently has EKG changes from her prior EKG dated 03/2020 with new T wave inversions in the anterior/anteroseptal leads. Patient is asked to seek medical attention sooner if her symptoms increase in intensity, frequency, duration, or requires more than 2 sublingual nitroglycerin tablets 5 minutes apart.  Also spoke to her husband over the phone and updated him as well as answered his questions.  The procedure of left heart catheterization with possible intervention was explained to the patient in detail.  The indication, alternatives, risks and benefits were reviewed.  Complications include but not limited to bleeding, infection, vascular injury, stroke, myocardial infection, arrhythmia, kidney injury requiring hemodialysis, radiation-related injury in the case of prolonged fluoroscopy use, emergency cardiac surgery, and death. The patient understands the risks of serious complication is 1-2 in 1000 with diagnostic cardiac cath and 1-2% or less with angioplasty/stenting.  The patient and her husband voices understanding and  provides verbal feedback and wishes to proceed with coronary angiography with possible PCI.  Hypertension:  Continue current medical tx.  Started Toprol XL  Recommended low salt diet advised to keep a blood pressure log for further monitoring.  Family history of premature coronary artery disease: We will start aspirin and statin therapy for now until the left heart catheterization is complete   FINAL MEDICATION LIST END OF ENCOUNTER: Meds ordered this encounter  Medications  . nitroGLYCERIN (NITROSTAT) 0.4 MG SL tablet    Sig: Place 1 tablet (0.4 mg total) under the  tongue every 5 (five) minutes as needed for chest pain. If you require more than two tablets five minutes apart go to the nearest ER via EMS.    Dispense:  30 tablet    Refill:  0  . aspirin EC 81 MG tablet    Sig: Take 1 tablet (81 mg total) by mouth daily. Swallow whole.    Dispense:  30 tablet    Refill:  0  . atorvastatin (LIPITOR) 40 MG tablet    Sig: Take 1 tablet (40 mg total) by mouth at bedtime.    Dispense:  30 tablet    Refill:  0  . metoprolol succinate (TOPROL XL) 25 MG 24 hr tablet    Sig: Take 1 tablet (25 mg total) by mouth daily.    Dispense:  30 tablet    Refill:  0    Medications Discontinued During This Encounter  Medication Reason  . omeprazole (PRILOSEC) 40 MG capsule Error     Current Outpatient Medications:  .  aspirin EC 81 MG tablet, Take 1 tablet (81 mg total) by mouth daily. Swallow whole., Disp: 30 tablet, Rfl: 0 .  atorvastatin (LIPITOR) 40 MG tablet, Take 1 tablet (40 mg total) by mouth at bedtime., Disp: 30 tablet, Rfl: 0 .  estradiol (VIVELLE-DOT) 0.1 MG/24HR patch, APPLY 1 PATCH TWICE WEEKLY., Disp: 24 patch, Rfl: 4 .  losartan (COZAAR) 50 MG tablet, TAKE 1 TABLET BY MOUTH DAILY., Disp: 90 tablet, Rfl: 0 .  metoprolol succinate (TOPROL XL) 25 MG 24 hr tablet, Take 1 tablet (25 mg total) by mouth daily., Disp: 30 tablet, Rfl: 0 .  naratriptan (AMERGE) 2.5 MG tablet, Take one (1)  tablet at onset of headache; if returns or does not resolve, may repeat after 4 hours; do not exceed five (5) mg in 24 hours., Disp: 9 tablet, Rfl: 1 .  nitroGLYCERIN (NITROSTAT) 0.4 MG SL tablet, Place 1 tablet (0.4 mg total) under the tongue every 5 (five) minutes as needed for chest pain. If you require more than two tablets five minutes apart go to the nearest ER via EMS., Disp: 30 tablet, Rfl: 0 .  progesterone (PROMETRIUM) 100 MG capsule, Take 1 capsule (100 mg total) by mouth at bedtime., Disp: 90 capsule, Rfl: 3 .  VITAMIN D PO, Take by mouth., Disp: , Rfl:   Orders Placed This Encounter  Procedures  . SARS-COV-2 RNA,(COVID-19) QUAL NAAT  . Basic metabolic panel  . Magnesium  . Lipid Panel With LDL/HDL Ratio  . LDL cholesterol, direct  . CBC  . EKG 12-Lead    There are no Patient Instructions on file for this visit.   --Continue cardiac medications as reconciled in final medication list. --Return in about 2 weeks (around 01/16/2021) for Post heart catheterization. Or sooner if needed. --Continue follow-up with your primary care physician regarding the management of your other chronic comorbid conditions.  Patient's questions and concerns were addressed to her satisfaction. She voices understanding of the instructions provided during this encounter.   This note was created using a voice recognition software as a result there may be grammatical errors inadvertently enclosed that do not reflect the nature of this encounter. Every attempt is made to correct such errors.  Tessa LernerSunit Samuel Mcpeek, [place]DO, [facility] BaptistFACC  Pager: [phone] Office: [phone]

## 2021-01-03 ENCOUNTER — Ambulatory Visit: Payer: BC Managed Care – PPO | Admitting: Family Medicine

## 2021-01-03 DIAGNOSIS — I209 Angina pectoris, unspecified: Secondary | ICD-10-CM | POA: Diagnosis not present

## 2021-01-04 ENCOUNTER — Other Ambulatory Visit (HOSPITAL_COMMUNITY)
Admission: RE | Admit: 2021-01-04 | Discharge: 2021-01-04 | Disposition: A | Discharge status: Home / Self Care | Payer: BC Managed Care – PPO | Source: Ambulatory Visit | Attending: Cardiology | Admitting: Cardiology

## 2021-01-04 DIAGNOSIS — Z20822 Contact with and (suspected) exposure to covid-19: Secondary | ICD-10-CM | POA: Insufficient documentation

## 2021-01-04 DIAGNOSIS — Z01812 Encounter for preprocedural laboratory examination: Secondary | ICD-10-CM | POA: Insufficient documentation

## 2021-01-04 LAB — BASIC METABOLIC PANEL
BUN/Creatinine Ratio: 11 (ref 9–23)
BUN: 9 mg/dL (ref 6–24)
CO2: 24 mmol/L (ref 20–29)
Calcium: 9.6 mg/dL (ref 8.7–10.2)
Chloride: 101 mmol/L (ref 96–106)
Creatinine, Ser: 0.83 mg/dL (ref 0.57–1.00)
Glucose: 78 mg/dL (ref 65–99)
Potassium: 4.2 mmol/L (ref 3.5–5.2)
Sodium: 140 mmol/L (ref 134–144)
eGFR: 82 mL/min/{1.73_m2} (ref >59)

## 2021-01-04 LAB — CBC
Hematocrit: 41.4 % (ref 34.0–46.6)
Hemoglobin: 13.7 g/dL (ref 11.1–15.9)
MCH: 28.8 pg (ref 26.6–33.0)
MCHC: 33.1 g/dL (ref 31.5–35.7)
MCV: 87 fL (ref 79–97)
Platelets: 283 10*3/uL (ref 150–450)
RBC: 4.75 x10E6/uL (ref 3.77–5.28)
RDW: 12.7 % (ref 11.7–15.4)
WBC: 5.3 10*3/uL (ref 3.4–10.8)

## 2021-01-04 LAB — LIPID PANEL WITH LDL/HDL RATIO
Cholesterol, Total: 196 mg/dL (ref 100–199)
HDL: 63 mg/dL (ref >39)
LDL Chol Calc (NIH): 106 mg/dL — ABNORMAL HIGH (ref 0–99)
LDL/HDL Ratio: 1.7 ratio (ref 0.0–3.2)
Triglycerides: 156 mg/dL — ABNORMAL HIGH (ref 0–149)
VLDL Cholesterol Cal: 27 mg/dL (ref 5–40)

## 2021-01-04 LAB — LDL CHOLESTEROL, DIRECT: LDL Direct: 105 mg/dL — ABNORMAL HIGH (ref 0–99)

## 2021-01-04 LAB — MAGNESIUM: Magnesium: 2 mg/dL (ref 1.6–2.3)

## 2021-01-05 ENCOUNTER — Other Ambulatory Visit: Payer: Self-pay

## 2021-01-05 ENCOUNTER — Encounter (HOSPITAL_BASED_OUTPATIENT_CLINIC_OR_DEPARTMENT_OTHER): Payer: Self-pay | Admitting: *Deleted

## 2021-01-05 ENCOUNTER — Emergency Department (HOSPITAL_BASED_OUTPATIENT_CLINIC_OR_DEPARTMENT_OTHER): Payer: BC Managed Care – PPO

## 2021-01-05 ENCOUNTER — Observation Stay (HOSPITAL_BASED_OUTPATIENT_CLINIC_OR_DEPARTMENT_OTHER)
Admission: EM | Admit: 2021-01-05 | Discharge: 2021-01-07 | Disposition: A | Discharge status: Home / Self Care | Payer: BC Managed Care – PPO | Attending: Cardiology | Admitting: Cardiology

## 2021-01-05 DIAGNOSIS — Z79899 Other long term (current) drug therapy: Secondary | ICD-10-CM | POA: Insufficient documentation

## 2021-01-05 DIAGNOSIS — I214 Non-ST elevation (NSTEMI) myocardial infarction: Secondary | ICD-10-CM | POA: Insufficient documentation

## 2021-01-05 DIAGNOSIS — I200 Unstable angina: Principal | ICD-10-CM | POA: Insufficient documentation

## 2021-01-05 DIAGNOSIS — Z7982 Long term (current) use of aspirin: Secondary | ICD-10-CM | POA: Insufficient documentation

## 2021-01-05 DIAGNOSIS — I10 Essential (primary) hypertension: Secondary | ICD-10-CM | POA: Insufficient documentation

## 2021-01-05 DIAGNOSIS — I209 Angina pectoris, unspecified: Secondary | ICD-10-CM

## 2021-01-05 DIAGNOSIS — Z955 Presence of coronary angioplasty implant and graft: Secondary | ICD-10-CM

## 2021-01-05 DIAGNOSIS — R079 Chest pain, unspecified: Secondary | ICD-10-CM | POA: Diagnosis not present

## 2021-01-05 LAB — CBC WITH DIFFERENTIAL/PLATELET
Abs Immature Granulocytes: 0.01 10*3/uL (ref 0.00–0.07)
Basophils Absolute: 0 10*3/uL (ref 0.0–0.1)
Basophils Relative: 0 %
Eosinophils Absolute: 0.3 10*3/uL (ref 0.0–0.5)
Eosinophils Relative: 4 %
HCT: 39.7 % (ref 36.0–46.0)
Hemoglobin: 13.4 g/dL (ref 12.0–15.0)
Immature Granulocytes: 0 %
Lymphocytes Relative: 28 %
Lymphs Abs: 2 10*3/uL (ref 0.7–4.0)
MCH: 28.9 pg (ref 26.0–34.0)
MCHC: 33.8 g/dL (ref 30.0–36.0)
MCV: 85.7 fL (ref 80.0–100.0)
Monocytes Absolute: 0.5 10*3/uL (ref 0.1–1.0)
Monocytes Relative: 6 %
Neutro Abs: 4.5 10*3/uL (ref 1.7–7.7)
Neutrophils Relative %: 62 %
Platelets: 282 10*3/uL (ref 150–400)
RBC: 4.63 MIL/uL (ref 3.87–5.11)
RDW: 12.3 % (ref 11.5–15.5)
WBC: 7.2 10*3/uL (ref 4.0–10.5)
nRBC: 0 % (ref 0.0–0.2)

## 2021-01-05 LAB — COMPREHENSIVE METABOLIC PANEL
ALT: 20 U/L (ref 0–44)
AST: 22 U/L (ref 15–41)
Albumin: 4 g/dL (ref 3.5–5.0)
Alkaline Phosphatase: 71 U/L (ref 38–126)
Anion gap: 7 (ref 5–15)
BUN: 15 mg/dL (ref 6–20)
CO2: 29 mmol/L (ref 22–32)
Calcium: 8.9 mg/dL (ref 8.9–10.3)
Chloride: 99 mmol/L (ref 98–111)
Creatinine, Ser: 0.95 mg/dL (ref 0.44–1.00)
GFR, Estimated: >60 mL/min (ref >60)
Glucose, Bld: 126 mg/dL — ABNORMAL HIGH (ref 70–99)
Potassium: 3.3 mmol/L — ABNORMAL LOW (ref 3.5–5.1)
Sodium: 135 mmol/L (ref 135–145)
Total Bilirubin: 0.2 mg/dL — ABNORMAL LOW (ref 0.3–1.2)
Total Protein: 7.1 g/dL (ref 6.5–8.1)

## 2021-01-05 LAB — D-DIMER, QUANTITATIVE: D-Dimer, Quant: 0.29 ug/mL-FEU (ref 0.00–0.50)

## 2021-01-05 LAB — SARS CORONAVIRUS 2 (TAT 6-24 HRS): SARS Coronavirus 2: NEGATIVE

## 2021-01-05 LAB — TROPONIN I (HIGH SENSITIVITY): Troponin I (High Sensitivity): 6 ng/L (ref <18)

## 2021-01-05 MED ORDER — HEPARIN (PORCINE) 25000 UT/250ML-% IV SOLN
750.0000 [IU]/h | INTRAVENOUS | Status: DC
  Administered 2021-01-06: 850 [IU]/h via INTRAVENOUS
  Administered 2021-01-07: 750 [IU]/h via INTRAVENOUS
  Filled 2021-01-05 (×2): qty 250

## 2021-01-05 MED ORDER — ONDANSETRON HCL 4 MG/2ML IJ SOLN
4.0000 mg | Freq: Once | INTRAMUSCULAR | Status: AC
Start: 2021-01-05 — End: 2021-01-05
  Administered 2021-01-05: 4 mg via INTRAVENOUS

## 2021-01-05 MED ORDER — FAMOTIDINE IN NACL 20-0.9 MG/50ML-% IV SOLN
20.0000 mg | Freq: Once | INTRAVENOUS | Status: AC
  Administered 2021-01-05: 20 mg via INTRAVENOUS
  Filled 2021-01-05: qty 50

## 2021-01-05 MED ORDER — ONDANSETRON HCL 4 MG/2ML IJ SOLN
INTRAMUSCULAR | Status: AC
  Filled 2021-01-05: qty 2

## 2021-01-05 MED ORDER — NITROGLYCERIN 0.4 MG SL SUBL
0.4000 mg | SUBLINGUAL_TABLET | SUBLINGUAL | Status: DC | PRN
  Administered 2021-01-05: 0.4 mg via SUBLINGUAL
  Filled 2021-01-05: qty 1

## 2021-01-05 MED ORDER — ASPIRIN 81 MG PO CHEW
324.0000 mg | CHEWABLE_TABLET | Freq: Once | ORAL | Status: AC
  Administered 2021-01-05: 324 mg via ORAL
  Filled 2021-01-05: qty 4

## 2021-01-05 MED ORDER — HEPARIN BOLUS VIA INFUSION
4000.0000 [IU] | Freq: Once | INTRAVENOUS | Status: AC
  Administered 2021-01-06: 4000 [IU] via INTRAVENOUS

## 2021-01-05 MED ORDER — ALUM & MAG HYDROXIDE-SIMETH 200-200-20 MG/5ML PO SUSP
30.0000 mL | Freq: Once | ORAL | Status: AC
  Administered 2021-01-05: 30 mL via ORAL
  Filled 2021-01-05: qty 30

## 2021-01-05 NOTE — ED Notes (Signed)
EDP at bedside  

## 2021-01-05 NOTE — Progress Notes (Signed)
ANTICOAGULATION CONSULT NOTE - Initial Consult  Pharmacy Consult for Heparin Indication: chest pain/ACS  No Known Allergies  Patient Measurements:   Heparin Dosing Weight:   Vital Signs: Temp: 99 F (37.2 C) (04/09 2158) Temp Source: Oral (04/09 2158) BP: 133/75 (04/09 2330) Pulse Rate: 72 (04/09 2330)  Labs: Recent Labs    01/03/21 0737 01/05/21 2230  HGB 13.7 13.4  HCT 41.4 39.7  PLT 283 282  CREATININE 0.83 0.95  TROPONINIHS  --  6    Estimated Creatinine Clearance: 57.2 mL/min (by C-G formula based on SCr of 0.95 mg/dL).   Medical History: Past Medical History:  Diagnosis Date  . Hypertension   . Migraine     Medications:  No current facility-administered medications on file prior to encounter.   Current Outpatient Medications on File Prior to Encounter  Medication Sig Dispense Refill  . aspirin EC 81 MG tablet Take 1 tablet (81 mg total) by mouth daily. Swallow whole. (Patient taking differently: Take 81 mg by mouth at bedtime. Swallow whole.) 30 tablet 0  . atorvastatin (LIPITOR) 40 MG tablet Take 1 tablet (40 mg total) by mouth at bedtime. 30 tablet 0  . Cholecalciferol (VITAMIN D-3) 125 MCG (5000 UT) TABS Take 5,000 Units by mouth at bedtime.    Marland Kitchen estradiol (VIVELLE-DOT) 0.1 MG/24HR patch APPLY 1 PATCH TWICE WEEKLY. (Patient taking differently: Place 1 patch onto the skin 2 (two) times a week.) 24 patch 4  . losartan (COZAAR) 50 MG tablet TAKE 1 TABLET BY MOUTH DAILY. (Patient taking differently: Take 50 mg by mouth at bedtime.) 90 tablet 0  . metoprolol succinate (TOPROL XL) 25 MG 24 hr tablet Take 1 tablet (25 mg total) by mouth daily. 30 tablet 0  . naratriptan (AMERGE) 2.5 MG tablet Take one (1) tablet at onset of headache; if returns or does not resolve, may repeat after 4 hours; do not exceed five (5) mg in 24 hours. (Patient taking differently: Take 2.5 mg by mouth as needed for migraine. Take one (1) tablet at onset of headache; if returns or does  not resolve, may repeat after 4 hours; do not exceed five (5) mg in 24 hours.) 9 tablet 1  . nitroGLYCERIN (NITROSTAT) 0.4 MG SL tablet Place 1 tablet (0.4 mg total) under the tongue every 5 (five) minutes as needed for chest pain. If you require more than two tablets five minutes apart go to the nearest ER via EMS. 30 tablet 0  . progesterone (PROMETRIUM) 100 MG capsule Take 1 capsule (100 mg total) by mouth at bedtime. 90 capsule 3     Assessment: 55 y.o. female with chest pain for heparin  Goal of Therapy:  Heparin level 0.3-0.7 units/ml Monitor platelets by anticoagulation protocol: Yes   Plan:  Heparin 4000 units IV bolus, then start heparin 850 units/hr Check heparin level in 8 hours.   Theresa Bass 01/05/2021,11:48 PM

## 2021-01-05 NOTE — ED Notes (Signed)
Pt denies CP, but sts she feels weird and nauseous; EDP aware and wants to cont w/ NTG for BP

## 2021-01-05 NOTE — ED Triage Notes (Signed)
Pt reports she is scheduled for a heart cath on Monday. Tonight while sitting on the couch she had chest pain "burning". She took a nitro and came here

## 2021-01-05 NOTE — ED Provider Notes (Signed)
[facility] EMERGENCY DEPARTMENT Provider Note   CSN: [id number] Arrival date & time: 01/05/21  2149     History Chief Complaint  Patient presents with  . Chest Pain    Theresa Bass is a 55 y.o. female.  The history is provided by the patient, medical records and the spouse.  Chest Pain  Theresa Bass is a 55 y.o. female who presents to the Emergency Department complaining of chest pain. She presents the emergency department accompanied by her husband for evaluation of upper central chest pain. She has been experiencing episodic chest pain with exertion for the last month and has been evaluated by cardiology in the office last week. She is planned to have a heart cath on Monday to further evaluate her symptoms. Pain also occurs when she is in contact with cold air. This evening she was on the porch when she developed a recurrent episode of this chest burning with associated shortness of breath and flushing in her head and neck. She reports significant associated malaise. She did take one of her nitroglycerin's at home with improvement in her symptoms. On ED presentation her pain is down to 2/10. Symptoms started 30 minutes prior to ED arrival. She does have a history of hypertension. She has a family history of coronary artery disease. She is on hormone replacement therapy. She states that her watch at home stated she had a heart rate of 117.    Past Medical History:  Diagnosis Date  . Hypertension   . Migraine     Patient Active Problem List   Diagnosis Date Noted  . Unstable angina (HCC) 01/05/2021  . Migraine     Past Surgical History:  Procedure Laterality Date  . CESAREAN SECTION     x 4  . ELBOW SURGERY    . HYSTEROSCOPY    . PELVIC LAPAROSCOPY    . TUBAL LIGATION       OB History    Gravida  5   Para  4   Term      Preterm      AB  1   Living  4     SAB      IAB      Ectopic      Multiple      Live Births              Family  History  Problem Relation Age of Onset  . Hypertension Mother   . Heart disease Mother   . Hypertension Father   . Heart failure Father   . Hypertension Brother   . Hypertension Sister     Social History   Tobacco Use  . Smoking status: Never Smoker  . Smokeless tobacco: Never Used  Vaping Use  . Vaping Use: Never used  Substance Use Topics  . Alcohol use: Not Currently    Alcohol/week: 0.0 standard drinks    Comment: Rare  . Drug use: No    Home Medications Prior to Admission medications   Medication Sig Start Date End Date Taking? Authorizing Provider  aspirin EC 81 MG tablet Take 1 tablet (81 mg total) by mouth daily. Swallow whole. Patient taking differently: Take 81 mg by mouth at bedtime. Swallow whole. 01/02/21 02/01/21  Tolia, Sunit, DO  atorvastatin (LIPITOR) 40 MG tablet Take 1 tablet (40 mg total) by mouth at bedtime. 01/02/21 02/01/21  Tolia, Sunit, DO  Cholecalciferol (VITAMIN D-3) 125 MCG (5000 UT) TABS Take 5,000 Units by mouth at bedtime.  [provider]  estradiol (VIVELLE-DOT) 0.1 MG/24HR patch APPLY 1 PATCH TWICE WEEKLY. Patient taking differently: Place 1 patch onto the skin 2 (two) times a week. 11/11/18   Fontaine, Nadyne Coombes, MD  losartan (COZAAR) 50 MG tablet TAKE 1 TABLET BY MOUTH DAILY. Patient taking differently: Take 50 mg by mouth at bedtime. 11/02/20   Copland, Gwenlyn Found, MD  metoprolol succinate (TOPROL XL) 25 MG 24 hr tablet Take 1 tablet (25 mg total) by mouth daily. 01/02/21 02/01/21  Tolia, Sunit, DO  naratriptan (AMERGE) 2.5 MG tablet Take one (1) tablet at onset of headache; if returns or does not resolve, may repeat after 4 hours; do not exceed five (5) mg in 24 hours. Patient taking differently: Take 2.5 mg by mouth as needed for migraine. Take one (1) tablet at onset of headache; if returns or does not resolve, may repeat after 4 hours; do not exceed five (5) mg in 24 hours. 11/11/18   Fontaine, Nadyne Coombes, MD  nitroGLYCERIN (NITROSTAT) 0.4 MG  SL tablet Place 1 tablet (0.4 mg total) under the tongue every 5 (five) minutes as needed for chest pain. If you require more than two tablets five minutes apart go to the nearest ER via EMS. 01/02/21 02/01/21  Tolia, Sunit, DO  progesterone (PROMETRIUM) 100 MG capsule Take 1 capsule (100 mg total) by mouth at bedtime. 12/09/17   Fontaine, Nadyne Coombes, MD    Allergies    Patient has no known allergies.  Review of Systems   Review of Systems  Cardiovascular: Positive for chest pain.  All other systems reviewed and are negative.   Physical Exam Updated Vital Signs BP 133/75   Pulse 72   Temp 99 F (37.2 C) (Oral)   Resp 16   LMP 12/22/2020 (Approximate) Comment: states she takes HRT  SpO2 97%   Physical Exam Vitals and nursing note reviewed.  Constitutional:      Appearance: She is well-developed.     Comments: Uncomfortable appearing  HENT:     Head: Normocephalic and atraumatic.  Cardiovascular:     Rate and Rhythm: Normal rate and regular rhythm.     Heart sounds: No murmur heard.   Pulmonary:     Effort: Pulmonary effort is normal. No respiratory distress.     Breath sounds: Normal breath sounds.  Abdominal:     Palpations: Abdomen is soft.     Tenderness: There is no abdominal tenderness. There is no guarding or rebound.  Musculoskeletal:        General: No swelling or tenderness.  Skin:    General: Skin is warm and dry.  Neurological:     Mental Status: She is alert and oriented to person, place, and time.  Psychiatric:        Behavior: Behavior normal.     ED Results / Procedures / Treatments   Labs (all labs ordered are listed, but only abnormal results are displayed) Labs Reviewed  COMPREHENSIVE METABOLIC PANEL - Abnormal; Notable for the following components:      Result Value   Potassium 3.3 (*)    Glucose, Bld 126 (*)    Total Bilirubin 0.2 (*)    All other components within normal limits  CBC WITH DIFFERENTIAL/PLATELET  D-DIMER, QUANTITATIVE   TROPONIN I (HIGH SENSITIVITY)  TROPONIN I (HIGH SENSITIVITY)    EKG None  Radiology DG Chest Port 1 View  Result Date: 01/05/2021 CLINICAL DATA:  Chest pain EXAM: PORTABLE CHEST 1 VIEW COMPARISON:  None. FINDINGS:  The heart size and mediastinal contours are within normal limits. Both lungs are clear. The visualized skeletal structures are unremarkable. IMPRESSION: No acute abnormality of the lungs in AP portable projection. Electronically Signed   By: Lauralyn Primes M.D.   On: 01/05/2021 22:43    Procedures Procedures   Medications Ordered in ED Medications  nitroGLYCERIN (NITROSTAT) SL tablet 0.4 mg (0.4 mg Sublingual Given 01/05/21 2239)  famotidine (PEPCID) IVPB 20 mg premix (20 mg Intravenous New Bag/Given 01/05/21 2345)  aspirin chewable tablet 324 mg (324 mg Oral Given 01/05/21 2223)  ondansetron (ZOFRAN) injection 4 mg ( Intravenous Canceled Entry 01/05/21 2325)  alum & mag hydroxide-simeth (MAALOX/MYLANTA) 200-200-20 MG/5ML suspension 30 mL (30 mLs Oral Given 01/05/21 2340)    ED Course  I have reviewed the triage vital signs and the nursing notes.  Pertinent labs & imaging results that were available during my care of the patient were reviewed by me and considered in my medical decision making (see chart for details).    MDM Rules/Calculators/A&P                         patient here for evaluation of chest pain. EKG with T-wave inversion, similar when compared to priors. She is on hormone replacement therapy, D dimer was obtained, which is negative. Current presentation is not consistent with PE. Pain improved after nitroglycerin administration. Initial troponin is negative. Discussed patients presentation and findings with Dr. Odis Hollingshead with cardiology, plan to admit for ongoing workup for unstable angina. Patient and husband updated of findings of studies and she is in agreement with treatment plan.  Final Clinical Impression(s) / ED Diagnoses Final diagnoses:  Unstable angina [facility])     Rx / DC Orders ED Discharge Orders    None       Tilden Fossa, MD 01/05/21 2347

## 2021-01-05 NOTE — ED Notes (Signed)
Pt refuses further NTG because they give her a weird feeling

## 2021-01-05 NOTE — Progress Notes (Signed)
[facility] [address], [address] [place], [place] [address] [phone]  Date:  01/09/2021   Name:  Theresa Bass   DOB:  1966/03/12   MRN:  [id number]  PCP:  Pearline Cables, MD    Chief Complaint: Gastric Issues   History of Present Illness:  Theresa Bass is a 55 y.o. very pleasant female patient who presents with the following:  Generally healthy woman with history of HTN and migraine HA She was seen by cardiology on 4/6 with CP- they planned for an elective heart cath However she then presented to the ER and was admitted with CP, ended up with PCI: Admit date: 01/05/2021 Discharge date: 01/07/2021 Primary Discharge Diagnosis: Unstable angina Secondary Discharge Diagnosis: Mixed hyperlipidemia  55 year old Caucasian female with mixed hyperlipidemia, family history of CAD, was admitted with accelerated and unstable angina. Echocardiogram showed severe mid to distal LAD hypokinesis with preserved EF. Coronary angiogram showed severe single vessel CAD with mid 95% stenosis, successfully stented with Synergy 3.5X20 mm DES with excellent results.  Did the patient have an acute coronary syndrome (MI, NSTEMI, STEMI, etc) this admission?: Yes                               AHA/ACC Clinical Performance & Quality Measures: 1. Aspirin prescribed? - Yes 2. ADP Receptor Inhibitor (Plavix/Clopidogrel, Brilinta/Ticagrelor or Effient/Prasugrel) prescribed (includes medically managed patients)? - Yes 3. Beta Blocker prescribed? - Yes 4. High Intensity Statin (Lipitor 40-80mg  or Crestor 20-40mg ) prescribed? - Yes 5. EF assessed during THIS hospitalization? - Yes 6. For EF <40%, was ACEI/ARB prescribed? - Not Applicable (EF >/= 40%) 7. For EF <40%, Aldosterone Antagonist (Spironolactone or Eplerenone) prescribed? - Not Applicable (EF >/= 40%) 8. Cardiac Rehab Phase II ordered (including medically managed patients)? - Yes  Coronary  intervention 01/07/2021: LM: Normal LAD: Mid 95% stenosis LCx: Normal RCA: Normal  Successful IVUS guided percutaneous coronary intervention mid LAD PTCA and stent placement 3.5 X 20 mm Synergy drug-eluting stent 0% residual stenosis  Pap can be updated if she likes- she will do with her GYN this summer  mammo UTD cologuard UTD shingrix done covid booster:  Labs done just recently   She is taking her asa, brillinta, and toprol xl.  The left sided/ severe burning she was feeling in her chest with exertion is resolved, overall she feels much better.   However she notes more continued sx of possible GERD and may feel a burning in her right side chest. When she lays down at night she may feel like there is a bubble in her chest.   She is not taking anything for her stomach/ acid reducer at this time She had been on omeprazole in the past but did not take it consistently  She notes that pills may seem to get stuck in her throat- this does not happen with food   Accompanied by her husband today He relates that she is under a lot of stress right now- he wonders if she might benefit from prn medication to use when she is feeling very wound up  Her dad has been sick and is in and out of the hospital right now, he still lives alone and has been admitted several times  Patient Active Problem List   Diagnosis Date Noted  . Unstable angina (HCC) 01/05/2021  . Migraine     Past Medical  History:  Diagnosis Date  . Hypertension   . Migraine     Past Surgical History:  Procedure Laterality Date  . CESAREAN SECTION     x 4  . CORONARY STENT INTERVENTION N/A 01/07/2021   Procedure: CORONARY STENT INTERVENTION;  Surgeon: Elder Negus, MD;  Location: [facility] INVASIVE CV LAB;  Service: Cardiovascular;  Laterality: N/A;  . ELBOW SURGERY    . HYSTEROSCOPY    . INTRAVASCULAR ULTRASOUND/IVUS N/A 01/07/2021   Procedure: Intravascular Ultrasound/IVUS;  Surgeon: Elder Negus, MD;   Location: [facility] INVASIVE CV LAB;  Service: Cardiovascular;  Laterality: N/A;  . LEFT HEART CATH AND CORONARY ANGIOGRAPHY N/A 01/07/2021   Procedure: LEFT HEART CATH AND CORONARY ANGIOGRAPHY;  Surgeon: Elder Negus, MD;  Location: [facility] INVASIVE CV LAB;  Service: Cardiovascular;  Laterality: N/A;  . PELVIC LAPAROSCOPY    . TUBAL LIGATION      Social History   Tobacco Use  . Smoking status: Never Smoker  . Smokeless tobacco: Never Used  Vaping Use  . Vaping Use: Never used  Substance Use Topics  . Alcohol use: Not Currently    Alcohol/week: 0.0 standard drinks    Comment: Rare  . Drug use: No    Family History  Problem Relation Age of Onset  . Hypertension Mother   . Heart disease Mother   . Hypertension Father   . Heart failure Father   . Hypertension Brother   . Hypertension Sister     No Known Allergies  Medication list has been reviewed and updated.  Current Outpatient Medications on File Prior to Visit  Medication Sig Dispense Refill  . aspirin EC 81 MG tablet Take 1 tablet (81 mg total) by mouth daily. Swallow whole. (Patient taking differently: Take 81 mg by mouth at bedtime. Swallow whole.) 30 tablet 0  . atorvastatin (LIPITOR) 80 MG tablet Take 1 tablet (80 mg total) by mouth at bedtime. 30 tablet 1  . Cholecalciferol (VITAMIN D-3) 125 MCG (5000 UT) TABS Take 5,000 Units by mouth at bedtime.    Marland Kitchen estradiol (VIVELLE-DOT) 0.1 MG/24HR patch APPLY 1 PATCH TWICE WEEKLY. (Patient taking differently: Place 1 patch onto the skin 2 (two) times a week.) 24 patch 4  . losartan (COZAAR) 50 MG tablet TAKE 1 TABLET BY MOUTH DAILY. (Patient taking differently: Take 50 mg by mouth at bedtime.) 90 tablet 0  . metoprolol succinate (TOPROL XL) 25 MG 24 hr tablet Take 1 tablet (25 mg total) by mouth daily. (Patient taking differently: Take 12.5 mg by mouth in the morning and at bedtime.) 30 tablet 0  . naratriptan (AMERGE) 2.5 MG tablet Take one (1) tablet at onset of headache; if  returns or does not resolve, may repeat after 4 hours; do not exceed five (5) mg in 24 hours. (Patient taking differently: Take 2.5 mg by mouth 2 (two) times daily as needed for migraine. Take one (1) tablet at onset of headache; if returns or does not resolve, may repeat after 4 hours; do not exceed five (5) mg in 24 hours.) 9 tablet 1  . nitroGLYCERIN (NITROSTAT) 0.4 MG SL tablet Place 1 tablet (0.4 mg total) under the tongue every 5 (five) minutes as needed for chest pain. If you require more than two tablets five minutes apart go to the nearest ER via EMS. 30 tablet 0  . progesterone (PROMETRIUM) 100 MG capsule Take 1 capsule (100 mg total) by mouth at bedtime. 90 capsule 3  . ticagrelor (BRILINTA) 90 MG  TABS tablet Take 1 tablet (90 mg total) by mouth 2 (two) times daily. 60 tablet 1   No current facility-administered medications on file prior to visit.    Review of Systems:  As per HPI- otherwise negative.   Physical Examination: Vitals:   01/09/21 0850  BP: 122/72  Pulse: 62  Resp: 16  SpO2: 99%   Vitals:   01/09/21 0850  Weight: 154 lb (69.9 kg)  Height: 5' (1.524 m)   Body mass index is 30.08 kg/m. Ideal Body Weight: Weight in (lb) to have BMI = 25: 127.7  GEN: no acute distress.  Overweight, looks well  HEENT: Atraumatic, Normocephalic.  Ears and Nose: No external deformity. CV: RRR, No M/G/R. No JVD. No thrill. No extra heart sounds. PULM: CTA B, no wheezes, crackles, rhonchi. No retractions. No resp. distress. No accessory muscle use. ABD: S, NT, ND, +BS. No rebound. No HSM. EXTR: No c/c/e PSYCH: Normally interactive. Conversant.  Looks well, bruising on her arms from IV sticks etc   Assessment and Plan: Hospital discharge follow-up  Coronary artery disease due to calcified coronary lesion  Gastroesophageal reflux disease, unspecified whether esophagitis present - Plan: pantoprazole (PROTONIX) 40 MG tablet, Ambulatory referral to Gastroenterology  Acute  anxiety - Plan: ALPRAZolam (XANAX) 0.25 MG tablet, DISCONTINUED: ALPRAZolam (XANAX) 0.25 MG tablet, DISCONTINUED: ALPRAZolam (XANAX) 0.25 MG tablet, DISCONTINUED: ALPRAZolam (XANAX) 0.25 MG tablet  Following up today from recent admit for PCI. She is doing well in this regard, taking all medications and will see cardiology in about 10 days While most of her sx are resolved she continues to feel a discomfort sometimes in the right side of her chest, and may feel like pills get stuck in her throat. She may feel like she has GERD sx when she is supine esp after dinner.  Will have her start on a PPI and refer to GI, plan to visit with them in about one month  I did give her a small amount of xanax to have on hand, can use if any severe anxiety.  She is aware of possible sedation and habit formation with this medication   This visit occurred during the SARS-CoV-2 public health emergency.  Safety protocols were in place, including screening questions prior to the visit, additional usage of staff PPE, and extensive cleaning of exam room while observing appropriate contact time as indicated for disinfecting solutions.    Signed Abbe Amsterdam, MD

## 2021-01-05 NOTE — ED Notes (Signed)
ED Provider at bedside (Rees) 

## 2021-01-05 NOTE — Patient Instructions (Addendum)
Good to see you again today! I will set you up to see GI in a month or so if needed For now please start on pantoprazole as an acid reducer Use the alprazolam if needed for acute anxiety  Please let me know if you need anything or have any concerns Otherwise we can plan to visit in 6 months

## 2021-01-06 ENCOUNTER — Observation Stay (HOSPITAL_COMMUNITY): Payer: BC Managed Care – PPO

## 2021-01-06 DIAGNOSIS — Z79899 Other long term (current) drug therapy: Secondary | ICD-10-CM | POA: Diagnosis not present

## 2021-01-06 DIAGNOSIS — I10 Essential (primary) hypertension: Secondary | ICD-10-CM | POA: Diagnosis not present

## 2021-01-06 DIAGNOSIS — Z7982 Long term (current) use of aspirin: Secondary | ICD-10-CM | POA: Diagnosis not present

## 2021-01-06 DIAGNOSIS — I214 Non-ST elevation (NSTEMI) myocardial infarction: Secondary | ICD-10-CM | POA: Diagnosis not present

## 2021-01-06 DIAGNOSIS — I200 Unstable angina: Secondary | ICD-10-CM | POA: Diagnosis not present

## 2021-01-06 DIAGNOSIS — R079 Chest pain, unspecified: Secondary | ICD-10-CM | POA: Diagnosis not present

## 2021-01-06 DIAGNOSIS — Z8249 Family history of ischemic heart disease and other diseases of the circulatory system: Secondary | ICD-10-CM | POA: Diagnosis not present

## 2021-01-06 DIAGNOSIS — R9431 Abnormal electrocardiogram [ECG] [EKG]: Secondary | ICD-10-CM | POA: Diagnosis not present

## 2021-01-06 LAB — CBC
HCT: 37.6 % (ref 36.0–46.0)
Hemoglobin: 12.6 g/dL (ref 12.0–15.0)
MCH: 29 pg (ref 26.0–34.0)
MCHC: 33.5 g/dL (ref 30.0–36.0)
MCV: 86.6 fL (ref 80.0–100.0)
Platelets: 287 10*3/uL (ref 150–400)
RBC: 4.34 MIL/uL (ref 3.87–5.11)
RDW: 12.3 % (ref 11.5–15.5)
WBC: 6.6 10*3/uL (ref 4.0–10.5)
nRBC: 0 % (ref 0.0–0.2)

## 2021-01-06 LAB — TROPONIN I (HIGH SENSITIVITY)
Troponin I (High Sensitivity): 9 ng/L (ref <18)
Troponin I (High Sensitivity): 6 ng/L (ref <18)
Troponin I (High Sensitivity): 6 ng/L (ref <18)

## 2021-01-06 LAB — HEPARIN LEVEL (UNFRACTIONATED)
Heparin Unfractionated: 0.77 IU/mL — ABNORMAL HIGH (ref 0.30–0.70)
Heparin Unfractionated: 0.37 IU/mL (ref 0.30–0.70)

## 2021-01-06 LAB — HIV ANTIBODY (ROUTINE TESTING W REFLEX): HIV Screen 4th Generation wRfx: NONREACTIVE

## 2021-01-06 LAB — MRSA PCR SCREENING: MRSA by PCR: NEGATIVE

## 2021-01-06 LAB — MAGNESIUM: Magnesium: 1.9 mg/dL (ref 1.7–2.4)

## 2021-01-06 LAB — TSH: TSH: 1.793 u[IU]/mL (ref 0.350–4.500)

## 2021-01-06 MED ORDER — SODIUM CHLORIDE 0.9 % WEIGHT BASED INFUSION
3.0000 mL/kg/h | INTRAVENOUS | Status: AC
  Administered 2021-01-07: 3 mL/kg/h via INTRAVENOUS

## 2021-01-06 MED ORDER — METOPROLOL TARTRATE 12.5 MG HALF TABLET
12.5000 mg | ORAL_TABLET | Freq: Two times a day (BID) | ORAL | Status: DC
  Administered 2021-01-06 – 2021-01-07 (×4): 12.5 mg via ORAL
  Filled 2021-01-06 (×4): qty 1

## 2021-01-06 MED ORDER — ASPIRIN 81 MG PO CHEW: 324.0000 mg | CHEWABLE_TABLET | ORAL | Status: DC

## 2021-01-06 MED ORDER — POTASSIUM CHLORIDE 10 MEQ/100ML IV SOLN
10.0000 meq | INTRAVENOUS | Status: AC
  Administered 2021-01-06 (×2): 10 meq via INTRAVENOUS
  Filled 2021-01-06 (×2): qty 100

## 2021-01-06 MED ORDER — ATORVASTATIN CALCIUM 40 MG PO TABS
40.0000 mg | ORAL_TABLET | Freq: Every day | ORAL | Status: DC
  Administered 2021-01-06 (×2): 40 mg via ORAL
  Filled 2021-01-06 (×2): qty 1

## 2021-01-06 MED ORDER — SODIUM CHLORIDE 0.9% FLUSH: 3.0000 mL | INTRAVENOUS | Status: DC | PRN

## 2021-01-06 MED ORDER — SODIUM CHLORIDE 0.9 % WEIGHT BASED INFUSION
1.0000 mL/kg/h | INTRAVENOUS | Status: DC
  Administered 2021-01-07: 1 mL/kg/h via INTRAVENOUS

## 2021-01-06 MED ORDER — SODIUM CHLORIDE 0.9 % IV SOLN: 250.0000 mL | INTRAVENOUS | Status: DC | PRN

## 2021-01-06 MED ORDER — ONDANSETRON HCL 4 MG/2ML IJ SOLN
4.0000 mg | Freq: Four times a day (QID) | INTRAMUSCULAR | Status: DC | PRN
  Administered 2021-01-07: 4 mg via INTRAVENOUS
  Filled 2021-01-06: qty 2

## 2021-01-06 MED ORDER — ASPIRIN EC 81 MG PO TBEC
81.0000 mg | DELAYED_RELEASE_TABLET | Freq: Every day | ORAL | Status: DC
  Administered 2021-01-06: 81 mg via ORAL
  Filled 2021-01-06: qty 1

## 2021-01-06 MED ORDER — ASPIRIN 81 MG PO CHEW
81.0000 mg | CHEWABLE_TABLET | ORAL | Status: AC
Start: 2021-01-07 — End: 2021-01-07
  Administered 2021-01-07: 81 mg via ORAL
  Filled 2021-01-06: qty 1

## 2021-01-06 MED ORDER — ASPIRIN 300 MG RE SUPP: 300.0000 mg | RECTAL | Status: DC

## 2021-01-06 MED ORDER — SODIUM CHLORIDE 0.9% FLUSH
3.0000 mL | Freq: Two times a day (BID) | INTRAVENOUS | Status: DC
  Administered 2021-01-06: 3 mL via INTRAVENOUS

## 2021-01-06 MED ORDER — ACETAMINOPHEN 325 MG PO TABS: 650.0000 mg | ORAL_TABLET | ORAL | Status: DC | PRN

## 2021-01-06 NOTE — Progress Notes (Signed)
ANTICOAGULATION CONSULT NOTE   Pharmacy Consult for Heparin Indication: chest pain/ACS  No Known Allergies  Patient Measurements: Height: 5' (152.4 cm) Weight: 70 kg (154 lb 5.2 oz) IBW/kg (Calculated) : 45.5 Heparin Dosing Weight:   Vital Signs: Temp: 97.7 F (36.5 C) (04/10 1049) Temp Source: Oral (04/10 1049) BP: 131/84 (04/10 0812) Pulse Rate: 78 (04/10 0812)  Labs: Recent Labs    01/05/21 2230 01/06/21 0224 01/06/21 0754 01/06/21 1049 01/06/21 1438  HGB 13.4  --   --  12.6  --   HCT 39.7  --   --  37.6  --   PLT 282  --   --  287  --   HEPARINUNFRC  --   --  0.77*  --  0.37  CREATININE 0.95  --   --   --   --   TROPONINIHS 6 9  --   --  6    Estimated Creatinine Clearance: 57 mL/min (by C-G formula based on SCr of 0.95 mg/dL).   Medical History: Past Medical History:  Diagnosis Date  . Hypertension   . Migraine     Assessment: 55 y.o. Bass with chest pain on IV heparin for r/o ACS.  Heparin level is slightly above goal this AM.  No overt bleeding or complications noted.   Heparin level therapeutic at 0.37, CBC stable, cath tomorrow.  Goal of Therapy:  Heparin level 0.3-0.7 units/ml Monitor platelets by anticoagulation protocol: Yes   Plan:  Continue IV heparin 750 units/hr. Daily heparin level and CBC  Fredonia Highland, PharmD, [place], [facility] Clinical Pharmacist [phone] Please check AMION for all [facility] numbers 01/06/2021

## 2021-01-06 NOTE — Plan of Care (Signed)
  Problem: Education: Goal: Knowledge of General Education information will improve Description: Including pain rating scale, medication(s)/side effects and non-pharmacologic comfort measures Outcome: Progressing   Problem: Health Behavior/Discharge Planning: Goal: Ability to manage health-related needs will improve Outcome: Progressing   Problem: Clinical Measurements: Goal: Will remain free from infection Outcome: Progressing Goal: Respiratory complications will improve Outcome: Progressing   Problem: Activity: Goal: Risk for activity intolerance will decrease Outcome: Progressing   Problem: Nutrition: Goal: Adequate nutrition will be maintained Outcome: Progressing   Problem: Coping: Goal: Level of anxiety will decrease Outcome: Progressing   Problem: Elimination: Goal: Will not experience complications related to bowel motility Outcome: Progressing Goal: Will not experience complications related to urinary retention Outcome: Progressing   Problem: Pain Managment: Goal: General experience of comfort will improve Outcome: Progressing   Problem: Safety: Goal: Ability to remain free from injury will improve Outcome: Progressing   Problem: Skin Integrity: Goal: Risk for impaired skin integrity will decrease Outcome: Progressing   

## 2021-01-06 NOTE — ED Notes (Signed)
Pt husband at bedside during transfer, given card with pt room number and number for floor on it

## 2021-01-06 NOTE — H&P (Signed)
HISTORY AND PHYSICAL  Patient ID: Theresa Bass MRN: [id number] DOB/AGE: April 22, 1966 56 y.o.  Admit date: 01/05/2021 Attending physician: Tessa Lerner, DO Primary Physician:  Pearline Cables, MD  Chief complaint: chest pain  HPI:  Theresa Bass is a 56 y.o. female who presents with a chief complaint of " chest pain." Her past medical history and cardiovascular risk factors include: Postmenopausal female, hypertension, and hormone replacement therapy, family history of premature CAD.  Patient is accompanied by her husband Raiford Noble and brother at bedside.  Patient provides verbal consent in regards to discussing her medical information in their presence.  Recently saw her in consultation at the office on January 02, 2021 for abnormal EKG and chest pain.  Her symptoms were concerning for angina pectoris and EKG noted new T wave inversions in the anterior anteroseptal leads therefore the shared decision was to proceed with left heart catheterization electively tomorrow 01/07/2021.  In the interim she was started on aspirin, statin therapy, Toprol-XL, and sublingual nitroglycerin tablets on a as needed basis.  Patient presented med [facility] yesterday with symptoms of chest pain.  She states that on Thursday she was having chest pain/pressure and the symptoms improved after taking 1 sublingual nitroglycerin tablet.  She is doing okay and doing her chores likely and did well on Friday.  However, yesterday night she went outside in the cold weather started experiencing chest discomfort, nausea, diaphoresis, a warm sensation/burning in her chest therefore decided to come to the hospital for further evaluation and management.  Hemodynamically patient remained stable, EKG similar to prior EKG from earlier this week, troponins negative x2.  However due to progressive symptoms patient was admitted for unstable angina.  She was transferred to [facility] for further evaluation and  management.  During morning rounds she is currently on IV heparin drip and has not had any chest discomfort.  ALLERGIES: No Known Allergies  PAST MEDICAL HISTORY: Past Medical History:  Diagnosis Date  . Hypertension   . Migraine     PAST SURGICAL HISTORY: Past Surgical History:  Procedure Laterality Date  . CESAREAN SECTION     x 4  . ELBOW SURGERY    . HYSTEROSCOPY    . PELVIC LAPAROSCOPY    . TUBAL LIGATION      FAMILY HISTORY: The patient family history includes Heart disease in her mother; Heart failure in her father; Hypertension in her brother, father, mother, and sister.   SOCIAL HISTORY:  The patient  reports that she has never smoked. She has never used smokeless tobacco. She reports previous alcohol use. She reports that she does not use drugs.  MEDICATIONS: Current Outpatient Medications  Medication Instructions  . aspirin EC 81 mg, Oral, Daily, Swallow whole.  Marland Kitchen atorvastatin (LIPITOR) 40 mg, Oral, Daily at bedtime  . estradiol (VIVELLE-DOT) 0.1 MG/24HR patch APPLY 1 PATCH TWICE WEEKLY.  Marland Kitchen losartan (COZAAR) 50 MG tablet TAKE 1 TABLET BY MOUTH DAILY.  . metoprolol succinate (TOPROL XL) 25 mg, Oral, Daily  . naratriptan (AMERGE) 2.5 MG tablet Take one (1) tablet at onset of headache; if returns or does not resolve, may repeat after 4 hours; do not exceed five (5) mg in 24 hours.  . nitroGLYCERIN (NITROSTAT) 0.4 mg, Sublingual, Every 5 min PRN, If you require more than two tablets five minutes apart go to the nearest ER via EMS.  . progesterone (PROMETRIUM) 100 mg, Oral, Daily at bedtime  . Vitamin D-3 5,000 Units, Oral, Daily at bedtime    .  heparin 750 Units/hr (01/06/21 0906)    REVIEW OF SYSTEMS: Review of Systems  Constitutional: Positive for diaphoresis. Negative for chills and fever.  HENT: Negative for hoarse voice and nosebleeds.   Eyes: Negative for discharge, double vision and pain.  Cardiovascular: Positive for chest pain. Negative for  claudication, dyspnea on exertion, leg swelling, near-syncope, orthopnea, palpitations, paroxysmal nocturnal dyspnea and syncope.  Respiratory: Negative for hemoptysis and shortness of breath.   Musculoskeletal: Negative for muscle cramps and myalgias.  Gastrointestinal: Positive for nausea. Negative for abdominal pain, constipation, diarrhea, hematemesis, hematochezia, melena and vomiting.  Neurological: Negative for dizziness and light-headedness.  All other systems reviewed and are negative.   PHYSICAL EXAM: Vitals with BMI 01/06/2021 01/06/2021 01/06/2021  Height - - 5\' 0"   Weight - - 154 lbs 5 oz  BMI - - 30.14  Systolic 131 145  Diastolic 84 82 75  Pulse 78 - 84   Net IO Since Admission: 360.58 mL [01/06/21 1301] CONSTITUTIONAL: Well-developed and well-nourished. No acute distress.  SKIN: Skin is warm and dry. No rash noted. No cyanosis. No pallor. No jaundice HEAD: Normocephalic and atraumatic.  EYES: No scleral icterus MOUTH/THROAT: Moist oral membranes.  NECK: No JVD present. No thyromegaly noted. No carotid bruits  LYMPHATIC: No visible cervical adenopathy.  CHEST Normal respiratory effort. No intercostal retractions  LUNGS: Clear to auscultation bilaterally.  No stridor. No wheezes. No rales.  CARDIOVASCULAR: Regular rate and rhythm, positive S1-S2, no murmurs rubs or gallops appreciated. ABDOMINAL: No apparent ascites.  EXTREMITIES: No peripheral edema  HEMATOLOGIC: No significant bruising NEUROLOGIC: Oriented to person, place, and time. Nonfocal. Normal muscle tone.  PSYCHIATRIC: Normal mood and affect. Normal behavior. Cooperative  RADIOLOGY: DG Chest Port 1 View  Result Date: 01/05/2021 CLINICAL DATA:  Chest pain EXAM: PORTABLE CHEST 1 VIEW COMPARISON:  None. FINDINGS: The heart size and mediastinal contours are within normal limits. Both lungs are clear. The visualized skeletal structures are unremarkable. IMPRESSION: No acute abnormality of the lungs in AP  portable projection. Electronically Signed   By: 03/07/2021 M.D.   On: 01/05/2021 22:43    LABORATORY DATA: Lab Results  Component Value Date   WBC 6.6 01/06/2021   HGB 12.6 01/06/2021   HCT 37.6 01/06/2021   MCV 86.6 01/06/2021   PLT 287 01/06/2021    Recent Labs  Lab 01/05/21 2230  NA 135  K 3.3*  CL 99  CO2 29  BUN 15  CREATININE 0.95  CALCIUM 8.9  PROT 7.1  BILITOT 0.2*  ALKPHOS 71  ALT 20  AST 22  GLUCOSE 126*    Lipid Panel  Lab Results  Component Value Date   CHOL 196 01/03/2021   HDL 63 01/03/2021   LDLCALC 106 (H) 01/03/2021   LDLDIRECT 105 (H) 01/03/2021   TRIG 156 (H) 01/03/2021   CHOLHDL 3 01/02/2020    BNP (last 3 results) No results for input(s): BNP in the last 8760 hours.  HEMOGLOBIN A1C Lab Results  Component Value Date   HGBA1C 5.4 01/02/2020    Cardiac Panel (last 3 results) Recent Labs    01/05/21 2230 01/06/21 0224  TROPONINIHS 6 9   TSH No results for input(s): TSH in the last 8760 hours.    CARDIAC DATABASE: EKG: 01/02/2021: Normal sinus rhythm, 71 bpm, consider old anteroseptal infarct, T wave inversions in leads V1-V4 possible anteroseptal/anterior ischemia.  Without underlying injury pattern.    01/06/2021: Normal sinus rhythm, 89 bpm, consider old anteroseptal infarct, T wave  inversions in leads V1-V4 suggestive of underlying ischemia in the anterior/anteroseptal.  01/06/2021: Normal sinus rhythm, 62 bpm, diffuse T wave inversions in the anterior precordial leads, without underlying injury pattern.  Compared to prior EKG ST depressions in V5 V6 are new.  Echocardiogram: None  Stress Testing:  None  Heart Catheterization: None  IMPRESSION & RECOMMENDATIONS: Mekaylah Klich is a 55 y.o. Caucasian female whose past medical history and cardiovascular risk factors include:  Postmenopausal female, hypertension, and hormone replacement therapy, family history of premature CAD.  Unstable angina: Currently chest  pain-free. Heparin per ACS protocol. EKG this morning notes more diffuse T wave inversions in the anterior precordial leads without underlying injury pattern. Check troponins. Recheck EKG. Continue aspirin 81 mg p.o. daily Lipitor 40 mg p.o. nightly Continue Lopressor 12.5 mg p.o. twice daily Echocardiogram pending No nitroglycerin tablets if needed. Tentatively scheduled for left heart catheterization tomorrow morning. The procedure of left heart catheterization with possible intervention was explained to the patient in detail.  The indication, alternatives, risks and benefits were reviewed.  Complications include but not limited to bleeding, infection, vascular injury, stroke, myocardial infection, arrhythmia, kidney injury requiring hemodialysis, radiation-related injury in the case of prolonged fluoroscopy use, emergency cardiac surgery, and death. The patient understands the risks of serious complication is 1-2 in 1000 with diagnostic cardiac cath and 1-2% or less with angioplasty/stenting.  The patient and her husband voices understanding and provides verbal feedback and wishes to proceed with coronary angiography with possible PCI.  Benign essential hypertension: Continue to monitor blood pressure. Continue medical therapy. Low-salt diet recommended.  Hormone replacement therapy: D-dimer was within normal limits  Family history of premature CAD.  Continue aspirin and statin therapy.  Plan of care discussed with the patient, her husband, and brother at bedside.  Their questions or concerns were addressed to their satisfaction.  Consultants: None   Code Status: Full     Family Communication:  Spoke to her husband Raiford Noble) and brother at bedside.     Disposition Plan: Home    CRITICAL CARE Performed by: Tessa Lerner   Total critical care time: 65 minutes   Critical care time was exclusive of separately billable procedures and treating other patients.   Critical care was  necessary to treat or prevent imminent or life-threatening deterioration.   Critical care was time spent personally by me on the following activities: development of treatment plan with patient and/or surrogate as well as nursing, discussions with consultants, evaluation of patient's response to treatment, examination of patient, obtaining history from patient or surrogate, ordering and performing treatments and interventions, ordering and review of laboratory studies, ordering and review of radiographic studies, pulse oximetry and re-evaluation of patient's condition.  Patient's questions and concerns were addressed to her and her husband's satisfaction. She and her husband voices understanding of the instructions provided during this encounter.   This note was created using a voice recognition software as a result there may be grammatical errors inadvertently enclosed that do not reflect the nature of this encounter. Every attempt is made to correct such errors.  Delilah Shan [facility]  Pager: [phone] Office: [phone] 01/06/2021, 1:01 PM

## 2021-01-06 NOTE — Progress Notes (Signed)
  Echocardiogram 2D Echocardiogram has been performed.  Theresa Bass 01/06/2021, 2:15 PM

## 2021-01-06 NOTE — Progress Notes (Signed)
Patient transferred from [facility] to [facility] hospital due to Unstable Angina.  She reached [facility] around 145am.  Spoke to her RN over the phone.  Patient denies CP or shortness of breath.  She is currently on IV Heparin  2nd Troponin pending  Admit orders placed.   Will see in the morning & feel free to call if questions arise.   Tessa Lerner, [place], [facility]  Pager: [phone] Office: [phone]

## 2021-01-06 NOTE — Progress Notes (Signed)
ANTICOAGULATION CONSULT NOTE   Pharmacy Consult for Heparin Indication: chest pain/ACS  No Known Allergies  Patient Measurements: Height: 5' (152.4 cm) Weight: 70 kg (154 lb 5.2 oz) IBW/kg (Calculated) : 45.5 Heparin Dosing Weight:   Vital Signs: Temp: 97.8 F (36.6 C) (04/10 0812) Temp Source: Oral (04/10 0812) BP: 131/84 (04/10 0812) Pulse Rate: 78 (04/10 0812)  Labs: Recent Labs    01/05/21 2230 01/06/21 0224 01/06/21 0754  HGB 13.4  --   --   HCT 39.7  --   --   PLT 282  --   --   HEPARINUNFRC  --   --  0.77*  CREATININE 0.95  --   --   TROPONINIHS 6 9  --     Estimated Creatinine Clearance: 57 mL/min (by C-G formula based on SCr of 0.95 mg/dL).   Medical History: Past Medical History:  Diagnosis Date  . Hypertension   . Migraine     Assessment: 55 y.o. female with chest pain on IV heparin for r/o ACS.  Heparin level is slightly above goal this AM.  No overt bleeding or complications noted.  CBC not drawn this AM.  Goal of Therapy:  Heparin level 0.3-0.7 units/ml Monitor platelets by anticoagulation protocol: Yes   Plan:  Decrease IV heparin to 750 units/hr. Repeat heparin level and CBC in 6 hrs. Daily heparin level and CBC. F/u plans for cath tomorrow?  Reece Leader, Colon Flattery, BCCP Clinical Pharmacist  01/06/2021 8:50 AM   [facility] pharmacy phone numbers are listed on [email or web address]

## 2021-01-06 NOTE — H&P (View-Only) (Signed)
Patient transferred from [facility] to [facility] hospital due to Unstable Angina.  She reached [facility] around 145am.  Spoke to her RN over the phone.  Patient denies CP or shortness of breath.  She is currently on IV Heparin  2nd Troponin pending  Admit orders placed.   Will see in the morning & feel free to call if questions arise.   Jossiah Smoak, DO, FACC  Pager: [phone] Office: [phone]  

## 2021-01-07 ENCOUNTER — Other Ambulatory Visit (HOSPITAL_COMMUNITY): Payer: Self-pay

## 2021-01-07 ENCOUNTER — Encounter (HOSPITAL_COMMUNITY)
Admission: EM | Disposition: A | Discharge status: Home / Self Care | Payer: Self-pay | Source: Home / Self Care | Attending: Emergency Medicine

## 2021-01-07 ENCOUNTER — Other Ambulatory Visit: Payer: Self-pay

## 2021-01-07 ENCOUNTER — Ambulatory Visit (HOSPITAL_COMMUNITY): Admission: RE | Admit: 2021-01-07 | Payer: BC Managed Care – PPO | Source: Home / Self Care | Admitting: Cardiology

## 2021-01-07 ENCOUNTER — Encounter (HOSPITAL_COMMUNITY): Payer: Self-pay | Admitting: Cardiology

## 2021-01-07 DIAGNOSIS — I25110 Atherosclerotic heart disease of native coronary artery with unstable angina pectoris: Secondary | ICD-10-CM | POA: Diagnosis not present

## 2021-01-07 DIAGNOSIS — I10 Essential (primary) hypertension: Secondary | ICD-10-CM | POA: Diagnosis not present

## 2021-01-07 DIAGNOSIS — Z79899 Other long term (current) drug therapy: Secondary | ICD-10-CM | POA: Diagnosis not present

## 2021-01-07 DIAGNOSIS — I214 Non-ST elevation (NSTEMI) myocardial infarction: Secondary | ICD-10-CM | POA: Diagnosis not present

## 2021-01-07 DIAGNOSIS — I200 Unstable angina: Secondary | ICD-10-CM | POA: Diagnosis not present

## 2021-01-07 DIAGNOSIS — Z006 Encounter for examination for normal comparison and control in clinical research program: Secondary | ICD-10-CM

## 2021-01-07 HISTORY — PX: LEFT HEART CATH AND CORONARY ANGIOGRAPHY: CATH118249

## 2021-01-07 HISTORY — PX: INTRAVASCULAR ULTRASOUND/IVUS: CATH118244

## 2021-01-07 HISTORY — PX: CORONARY STENT INTERVENTION: CATH118234

## 2021-01-07 LAB — CBC
HCT: 37.4 % (ref 36.0–46.0)
Hemoglobin: 12.3 g/dL (ref 12.0–15.0)
MCH: 29.2 pg (ref 26.0–34.0)
MCHC: 32.9 g/dL (ref 30.0–36.0)
MCV: 88.8 fL (ref 80.0–100.0)
Platelets: 240 10*3/uL (ref 150–400)
RBC: 4.21 MIL/uL (ref 3.87–5.11)
RDW: 12.4 % (ref 11.5–15.5)
WBC: 5.6 10*3/uL (ref 4.0–10.5)
nRBC: 0 % (ref 0.0–0.2)

## 2021-01-07 LAB — BASIC METABOLIC PANEL
Anion gap: 3 — ABNORMAL LOW (ref 5–15)
BUN: 6 mg/dL (ref 6–20)
CO2: 27 mmol/L (ref 22–32)
Calcium: 8.2 mg/dL — ABNORMAL LOW (ref 8.9–10.3)
Chloride: 107 mmol/L (ref 98–111)
Creatinine, Ser: 0.84 mg/dL (ref 0.44–1.00)
GFR, Estimated: >60 mL/min (ref >60)
Glucose, Bld: 88 mg/dL (ref 70–99)
Potassium: 3.9 mmol/L (ref 3.5–5.1)
Sodium: 137 mmol/L (ref 135–145)

## 2021-01-07 LAB — ECHOCARDIOGRAM COMPLETE
Area-P 1/2: 3 cm2
Calc EF: 54.3 %
Height: 60 in
S' Lateral: 2.9 cm
Single Plane A2C EF: 53.1 %
Single Plane A4C EF: 56.7 %
Weight: 2469.15 oz

## 2021-01-07 LAB — HEPARIN LEVEL (UNFRACTIONATED): Heparin Unfractionated: 0.36 IU/mL (ref 0.30–0.70)

## 2021-01-07 LAB — POCT ACTIVATED CLOTTING TIME: Activated Clotting Time: 380 seconds

## 2021-01-07 SURGERY — LEFT HEART CATH AND CORONARY ANGIOGRAPHY
Anesthesia: LOCAL

## 2021-01-07 MED ORDER — LABETALOL HCL 5 MG/ML IV SOLN: 10.0000 mg | INTRAVENOUS | Status: AC | PRN

## 2021-01-07 MED ORDER — ATORVASTATIN CALCIUM 80 MG PO TABS
80.0000 mg | ORAL_TABLET | Freq: Every day | ORAL | 1 refills | Status: DC
  Filled 2021-01-07: qty 30, 30d supply, fill #0

## 2021-01-07 MED ORDER — NITROGLYCERIN 1 MG/10 ML FOR IR/CATH LAB
INTRA_ARTERIAL | Status: DC | PRN
  Administered 2021-01-07: 200 ug via INTRACORONARY

## 2021-01-07 MED ORDER — TICAGRELOR 90 MG PO TABS: 90.0000 mg | ORAL_TABLET | Freq: Two times a day (BID) | ORAL | Status: DC

## 2021-01-07 MED ORDER — HEPARIN (PORCINE) IN NACL 1000-0.9 UT/500ML-% IV SOLN
INTRAVENOUS | Status: DC | PRN
  Administered 2021-01-07 (×2): 500 mL

## 2021-01-07 MED ORDER — NITROGLYCERIN 1 MG/10 ML FOR IR/CATH LAB
INTRA_ARTERIAL | Status: AC
  Filled 2021-01-07: qty 10

## 2021-01-07 MED ORDER — VERAPAMIL HCL 2.5 MG/ML IV SOLN
INTRAVENOUS | Status: AC
  Filled 2021-01-07: qty 2

## 2021-01-07 MED ORDER — IOHEXOL 350 MG/ML SOLN
INTRAVENOUS | Status: DC | PRN
  Administered 2021-01-07: 50 mL

## 2021-01-07 MED ORDER — MIDAZOLAM HCL 2 MG/2ML IJ SOLN
INTRAMUSCULAR | Status: AC
  Filled 2021-01-07: qty 2

## 2021-01-07 MED ORDER — LIDOCAINE HCL (PF) 1 % IJ SOLN
INTRAMUSCULAR | Status: AC
  Filled 2021-01-07: qty 30

## 2021-01-07 MED ORDER — SODIUM CHLORIDE 0.9% FLUSH: 3.0000 mL | INTRAVENOUS | Status: DC | PRN

## 2021-01-07 MED ORDER — SODIUM CHLORIDE 0.9 % IV SOLN: 250.0000 mL | INTRAVENOUS | Status: DC | PRN

## 2021-01-07 MED ORDER — TICAGRELOR 90 MG PO TABS
ORAL_TABLET | ORAL | Status: AC
  Filled 2021-01-07: qty 1

## 2021-01-07 MED ORDER — LIDOCAINE HCL (PF) 1 % IJ SOLN
INTRAMUSCULAR | Status: DC | PRN
  Administered 2021-01-07: 2 mL

## 2021-01-07 MED ORDER — HEPARIN SODIUM (PORCINE) 1000 UNIT/ML IJ SOLN
INTRAMUSCULAR | Status: DC | PRN
  Administered 2021-01-07 (×2): 4000 [IU] via INTRAVENOUS

## 2021-01-07 MED ORDER — HEPARIN SODIUM (PORCINE) 1000 UNIT/ML IJ SOLN
INTRAMUSCULAR | Status: AC
  Filled 2021-01-07: qty 1

## 2021-01-07 MED ORDER — TICAGRELOR 90 MG PO TABS
90.0000 mg | ORAL_TABLET | Freq: Two times a day (BID) | ORAL | 1 refills | Status: DC
  Filled 2021-01-07: qty 60, 30d supply, fill #0

## 2021-01-07 MED ORDER — VERAPAMIL HCL 2.5 MG/ML IV SOLN
INTRAVENOUS | Status: DC | PRN
  Administered 2021-01-07: 10 mL via INTRA_ARTERIAL

## 2021-01-07 MED ORDER — TICAGRELOR 90 MG PO TABS
ORAL_TABLET | ORAL | Status: DC | PRN
  Administered 2021-01-07: 180 mg via ORAL

## 2021-01-07 MED ORDER — HEPARIN (PORCINE) IN NACL 1000-0.9 UT/500ML-% IV SOLN
INTRAVENOUS | Status: AC
  Filled 2021-01-07: qty 1000

## 2021-01-07 MED ORDER — FENTANYL CITRATE (PF) 100 MCG/2ML IJ SOLN
INTRAMUSCULAR | Status: AC
  Filled 2021-01-07: qty 2

## 2021-01-07 MED ORDER — HYDRALAZINE HCL 20 MG/ML IJ SOLN: 10.0000 mg | INTRAMUSCULAR | Status: AC | PRN

## 2021-01-07 MED ORDER — ONDANSETRON HCL 4 MG/2ML IJ SOLN: 4.0000 mg | Freq: Four times a day (QID) | INTRAMUSCULAR | Status: DC | PRN

## 2021-01-07 MED ORDER — TICAGRELOR 90 MG PO TABS
ORAL_TABLET | ORAL | Status: AC
  Filled 2021-01-07: qty 2

## 2021-01-07 MED ORDER — MIDAZOLAM HCL 2 MG/2ML IJ SOLN
INTRAMUSCULAR | Status: DC | PRN
  Administered 2021-01-07: 1 mg via INTRAVENOUS
  Administered 2021-01-07: 2 mg via INTRAVENOUS

## 2021-01-07 MED ORDER — FENTANYL CITRATE (PF) 100 MCG/2ML IJ SOLN
INTRAMUSCULAR | Status: DC | PRN
  Administered 2021-01-07: 50 ug via INTRAVENOUS
  Administered 2021-01-07: 25 ug via INTRAVENOUS

## 2021-01-07 MED ORDER — SODIUM CHLORIDE 0.9% FLUSH
3.0000 mL | Freq: Two times a day (BID) | INTRAVENOUS | Status: DC
  Administered 2021-01-07: 3 mL via INTRAVENOUS

## 2021-01-07 MED ORDER — ACETAMINOPHEN 325 MG PO TABS
650.0000 mg | ORAL_TABLET | ORAL | Status: DC | PRN
  Administered 2021-01-07: 650 mg via ORAL
  Filled 2021-01-07: qty 2

## 2021-01-07 MED ORDER — SODIUM CHLORIDE 0.9 % IV SOLN: INTRAVENOUS | Status: AC

## 2021-01-07 SURGICAL SUPPLY — 22 items
BALLN SAPPHIRE 2.5X15 (BALLOONS) ×2
BALLN SAPPHIRE ~~LOC~~ 3.5X15 (BALLOONS) ×2 IMPLANT
BALLOON SAPPHIRE 2.5X15 (BALLOONS) ×1 IMPLANT
CATH LAUNCHER 6FR EBU3.5 (CATHETERS) ×2 IMPLANT
CATH OPTICROSS HD (CATHETERS) ×2 IMPLANT
CATH OPTITORQUE TIG 4.0 5F (CATHETERS) ×2 IMPLANT
DEVICE RAD COMP TR BAND LRG (VASCULAR PRODUCTS) ×2 IMPLANT
GLIDESHEATH SLEND A-KIT 6F 22G (SHEATH) ×2 IMPLANT
GLIDESHEATH SLEND SS 6F .021 (SHEATH) ×2 IMPLANT
GUIDEWIRE INQWIRE 1.5J.035X260 (WIRE) ×1 IMPLANT
INQWIRE 1.5J .035X260CM (WIRE) ×2
KIT ENCORE 26 ADVANTAGE (KITS) ×2 IMPLANT
KIT HEART LEFT (KITS) ×2 IMPLANT
KIT HEMO VALVE WATCHDOG (MISCELLANEOUS) ×2 IMPLANT
PACK CARDIAC CATHETERIZATION (CUSTOM PROCEDURE TRAY) ×2 IMPLANT
SHEATH PROBE COVER 6X72 (BAG) ×2 IMPLANT
SLED PULL BACK IVUS (MISCELLANEOUS) ×2 IMPLANT
STENT SYNERGY XD 3.50X20 (Permanent Stent) ×1 IMPLANT
SYNERGY XD 3.50X20 (Permanent Stent) ×2 IMPLANT
TRANSDUCER W/STOPCOCK (MISCELLANEOUS) ×2 IMPLANT
TUBING CIL FLEX 10 FLL-RA (TUBING) ×2 IMPLANT
WIRE ASAHI PROWATER 180CM (WIRE) ×2 IMPLANT

## 2021-01-07 NOTE — Plan of Care (Signed)

## 2021-01-07 NOTE — Progress Notes (Signed)
[id number] Came at 1000 and pt requested to rest an hour as she had just gotten zofran. Returned around 1100 to educate. Discussed NTG use, importance of brilinta with stent, heart healthy food choices, walking for exercise, and CRP 2. Pt and husband voiced understanding. Will send referral letter to [facility] CRP 2. Luetta Nutting RN BSN 01/07/2021 11:40 AM

## 2021-01-07 NOTE — TOC Transition Note (Signed)
Transition of Care [facility]) - CM/SW Discharge Note   Patient Details  Name: Theresa Bass MRN: [id number] Date of Birth: Feb 05, 1966  [facility]) CM/SW Contact:  Zenon Mayo, RN Phone Number: 01/07/2021, 11:35 AM   Clinical Narrative:    Patient is for dc today, she will be on brilinta, TOC filled the first 30 day free with coupon for her,  NCM informed her that the copay right now is 417.46 due to deductible not being met.  Informed her once deductible is met she could use the 5.00 each month coupon.  But if it conts to be too high for her to let her Cardilologist know so they can switch her to something else. She and her husband states they understand.    Final next level of care: Home/Self Care Barriers to Discharge: No Barriers Identified   Patient Goals and CMS Choice Patient states their goals for this hospitalization and ongoing recovery are:: return home   Choice offered to / list presented to : NA  Discharge Placement                       Discharge Plan and Services                  DME Agency: NA       HH Arranged: NA          Social Determinants of Health (SDOH) Interventions     Readmission Risk Interventions No flowsheet data found.

## 2021-01-07 NOTE — Progress Notes (Signed)
ANTICOAGULATION CONSULT NOTE   Pharmacy Consult for Heparin Indication: chest pain/ACS  No Known Allergies  Patient Measurements: Height: 5' (152.4 cm) Weight: 70.3 kg (154 lb 15.7 oz) IBW/kg (Calculated) : 45.5 Heparin Dosing Weight:   Vital Signs: Temp: 97.6 F (36.4 C) (04/11 0300) Temp Source: Oral (04/11 0300) BP: 135/75 (04/11 0845) Pulse Rate: 78 (04/11 0845)  Labs: Recent Labs    01/05/21 2230 01/06/21 0224 01/06/21 0754 01/06/21 1049 01/06/21 1438 01/06/21 1620 01/07/21 0103 01/07/21 0615  HGB 13.4  --   --  12.6  --   --  12.3  --   HCT 39.7  --   --  37.6  --   --  37.4  --   PLT 282  --   --  287  --   --  240  --   HEPARINUNFRC  --   --  0.77*  --  0.37  --  0.36  --   CREATININE 0.95  --   --   --   --   --   --  0.84  TROPONINIHS 6 9  --   --  6 6  --   --     Estimated Creatinine Clearance: 64.6 mL/min (by C-G formula based on SCr of 0.84 mg/dL).   Medical History: Past Medical History:  Diagnosis Date  . Hypertension   . Migraine     Assessment: 55 y.o. female with chest pain on IV heparin for r/o ACS.  Heparin level is slightly above goal this AM.  No overt bleeding or complications noted.   Heparin level therapeutic at 0.36 this AM.  CBC stable.  Heparin turned off for cath this AM.  Goal of Therapy:  Heparin level 0.3-0.7 units/ml Monitor platelets by anticoagulation protocol: Yes   Plan:  Heparin off post cath. D/c home today on Brilinta.   Reece Leader, Colon Flattery, BCCP Clinical Pharmacist  01/07/2021 11:10 AM   [facility] pharmacy phone numbers are listed on [email or web address]

## 2021-01-07 NOTE — Interval H&P Note (Signed)
History and Physical Interval Note:  01/07/2021 7:44 AM  Theresa Bass  has presented today for surgery, with the diagnosis of hp - angina - abnormal ekg.  The various methods of treatment have been discussed with the patient and family. After consideration of risks, benefits and other options for treatment, the patient has consented to  Procedure(s): LEFT HEART CATH AND CORONARY ANGIOGRAPHY (N/A) as a surgical intervention.  The patient's history has been reviewed, patient examined, no change in status, stable for surgery.  I have reviewed the patient's chart and labs.  Questions were answered to the patient's satisfaction.    Unstable angina  2016 Appropriate Use Criteria for Coronary Revascularization in Patients With Acute Coronary Syndrome NSTEMI/UA Intermediate Risk (TIMI Score 3-4) NSTEMI/Unstable angina, stabilized patient at Intermediate Risk (TIMI Score 3-4) Link Here: [employer] Indication:  Revascularization by PCI or CABG of 1 or more arteries in a patient with NSTEMI or unstable angina with Stabilization after presentation Intermediate risk for clinical events  A (7) Indication: 16; Score 7   Theresa Bass J Alphonsa Brickle

## 2021-01-07 NOTE — Research (Signed)
IDENTIFY Informed Consent                  Subject Name: Theresa Bass   Subject met inclusion and exclusion criteria.  The informed consent form, study requirements and expectations were reviewed with the subject and questions and concerns were addressed prior to the signing of the consent form.  The subject verbalized understanding of the trial requirements.  The subject agreed to participate in the IDENTIFY trial and signed the informed consent at 07:00AM on 01/07/21.  The informed consent was obtained prior to performance of any protocol-specific procedures for the subject.  A copy of the signed informed consent was given to the subject and a copy was placed in the subject's medical record.   Meade Maw , Naval architect

## 2021-01-07 NOTE — Progress Notes (Incomplete)
Pt provided with verbal discharge instructions

## 2021-01-07 NOTE — TOC Benefit Eligibility Note (Signed)
Transition of Care [facility]) Benefit Eligibility Note    Patient Details  Name: Theresa Bass MRN: [id number] Date of Birth: August 15, 1966   Medication/Dose: Marden Noble  90 MG BID     Tier: 3 Drug  Prescription Coverage Preferred Pharmacy: [facility]  and  [facility] TRANSITIONS OF CARE  Spoke with Person/Company/Phone Number:: JANET  @ [employer] #  [phone]  Co-Pay: [phone]  Prior Approval: No  Deductible: Unmet Rosana Fret)       Mardene Sayer Phone Number: 01/07/2021, 10:31 AM

## 2021-01-07 NOTE — Discharge Summary (Signed)
Physician Discharge Summary  Patient ID: [name] MRN: [id number] DOB/AGE: Aug 11, 1966 55 y.o.  Admit date: 01/05/2021 Discharge date: 01/07/2021  Primary Discharge Diagnosis: Unstable angina  Secondary Discharge Diagnosis: Mixed hyperlipidemia   Hospital Course:   55 year old Caucasian female with mixed hyperlipidemia, family history of CAD, was admitted with accelerated and unstable angina. Echocardiogram showed severe mid to distal LAD hypokinesis with preserved EF. Coronary angiogram showed severe single vessel CAD with mid 95% stenosis, successfully stented with Synergy 3.5X20 mm DES with excellent results.   Did the patient have an acute coronary syndrome (MI, NSTEMI, STEMI, etc) this admission?: Yes                               AHA/ACC Clinical Performance & Quality Measures: 1. Aspirin prescribed? - Yes 2. ADP Receptor Inhibitor (Plavix/Clopidogrel, Brilinta/Ticagrelor or Effient/Prasugrel) prescribed (includes medically managed patients)? - Yes 3. Beta Blocker prescribed? - Yes 4. High Intensity Statin (Lipitor 40-80mg  or Crestor 20-40mg ) prescribed? - Yes 5. EF assessed during THIS hospitalization? - Yes 6. For EF <40%, was ACEI/ARB prescribed? - Not Applicable (EF >/= 40%) 7. For EF <40%, Aldosterone Antagonist (Spironolactone or Eplerenone) prescribed? - Not Applicable (EF >/= 40%) 8. Cardiac Rehab Phase II ordered (including medically managed patients)? - Yes     Discharge Exam: Blood pressure 135/75, pulse 78, temperature 97.6 F (36.4 C), temperature source Oral, resp. rate (!) 29, height 5' (1.524 m), weight 70.3 kg, last menstrual period 12/22/2020, SpO2 98 %.   Physical Exam    Significant Diagnostic Studies:  EKG 01/07/2021: Sinus rhythm Anteroseptal infarct, age indeterminate  Coronary intervention 01/07/2021: LM: Normal LAD: Mid 95% stenosis LCx: Normal RCA: Normal  Successful IVUS guided percutaneous coronary intervention mid LAD PTCA and  stent placement 3.5 X 20 mm Synergy drug-eluting stent 0% residual stenosis   Echocardiogram 01/06/2021: 1. Left ventricular ejection fraction, by estimation, is 50 to 55%. The  left ventricle has low normal function. The left ventricle demonstrates  regional wall motion abnormalities (see scoring diagram/findings for  description). Left ventricular diastolic  parameters are consistent with Grade I diastolic dysfunction (impaired  relaxation).  2. Right ventricular systolic function is normal. The right ventricular  size is normal.  3. The mitral valve is normal in structure. Trivial mitral valve  regurgitation. No evidence of mitral stenosis.  4. The aortic valve is normal in structure. Aortic valve regurgitation is  not visualized. No aortic stenosis is present.  5. The inferior vena cava is normal in size with greater than 50%  respiratory variability, suggesting right atrial pressure of 3 mmHg.   Labs:   Lab Results  Component Value Date   WBC 5.6 01/07/2021   HGB 12.3 01/07/2021   HCT 37.4 01/07/2021   MCV 88.8 01/07/2021   PLT 240 01/07/2021    Recent Labs  Lab 01/05/21 2230 01/07/21 0615  NA 135 137  K 3.3* 3.9  CL 99 107  CO2 29 27  BUN 15 6  CREATININE 0.95 0.84  CALCIUM 8.9 8.2*  PROT 7.1  --   BILITOT 0.2*  --   ALKPHOS 71  --   ALT 20  --   AST 22  --   GLUCOSE 126* 88    Lipid Panel     Component Value Date/Time   CHOL 196 01/03/2021 0737   TRIG 156 (H) 01/03/2021 0737   HDL 63 01/03/2021 0737   CHOLHDL 3 01/02/2020 1609  VLDL 36.4 01/02/2020 1609   LDLCALC 106 (H) 01/03/2021 0737   LDLCALC 106 (H) 12/08/2017 0857    BNP (last 3 results) No results for input(s): BNP in the last 8760 hours.  HEMOGLOBIN A1C Lab Results  Component Value Date   HGBA1C 5.4 01/02/2020     TSH Recent Labs    01/06/21 1620  TSH 1.793    Radiology: CARDIAC CATHETERIZATION  Result Date: 01/07/2021 LM: Normal LAD: Mid 95% stenosis LCx: Normal  RCA: Normal Successful percutaneous coronary intervention mid LAD PTCA and stent placement 3.5 X 20 mm Synergy drug-eluting stent 0% residual stenosis Elder Negus, MD Pager: [phone] Office: [phone]   DG Chest Port 1 View  Result Date: 01/05/2021 CLINICAL DATA:  Chest pain EXAM: PORTABLE CHEST 1 VIEW COMPARISON:  None. FINDINGS: The heart size and mediastinal contours are within normal limits. Both lungs are clear. The visualized skeletal structures are unremarkable. IMPRESSION: No acute abnormality of the lungs in AP portable projection. Electronically Signed   By: Lauralyn Primes M.D.   On: 01/05/2021 22:43   ECHOCARDIOGRAM COMPLETE  Result Date: 01/07/2021    ECHOCARDIOGRAM REPORT   Patient Name:   Theresa Bass Date of Exam: 01/06/2021 Medical Rec #:  [id number]      Height:       60.0 in Accession #:    [id number]     Weight:       154.3 lb Date of Birth:  May 12, 1966      BSA:          1.672 m Patient Age:    55 years       BP:           131/84 mmHg Patient Gender: F              HR:           72 bpm. Exam Location:  Inpatient Procedure: 2D Echo, 3D Echo, Cardiac Doppler and Color Doppler Indications:     R07.9* Chest pain, unspecified  History:         Patient has no prior history of Echocardiogram examinations.                  Signs/Symptoms:Chest Pain and Shortness of Breath.  Sonographer:     Sheralyn Boatman RDCS Referring Phys:  [id number] SUNIT TOLIA Diagnosing Phys: Tessa Lerner DO IMPRESSIONS  1. Left ventricular ejection fraction, by estimation, is 50 to 55%. The left ventricle has low normal function. The left ventricle demonstrates regional wall motion abnormalities (see scoring diagram/findings for description). Left ventricular diastolic  parameters are consistent with Grade I diastolic dysfunction (impaired relaxation).  2. Right ventricular systolic function is normal. The right ventricular size is normal.  3. The mitral valve is normal in structure. Trivial mitral valve regurgitation.  No evidence of mitral stenosis.  4. The aortic valve is normal in structure. Aortic valve regurgitation is not visualized. No aortic stenosis is present.  5. The inferior vena cava is normal in size with greater than 50% respiratory variability, suggesting right atrial pressure of 3 mmHg. FINDINGS  Left Ventricle: Left ventricular ejection fraction, by estimation, is 50 to 55%. The left ventricle has low normal function. The left ventricle demonstrates regional wall motion abnormalities. The left ventricular internal cavity size was normal in size. There is no left ventricular hypertrophy. Left ventricular diastolic parameters are consistent with Grade I diastolic dysfunction (impaired relaxation).  LV Wall Scoring: The mid and distal anterior septum, mid inferoseptal  segment, and apex are hypokinetic. The entire anterior wall, entire lateral wall, entire inferior wall, basal anteroseptal segment, and basal inferoseptal segment are normal. Right Ventricle: The right ventricular size is normal. No increase in right ventricular wall thickness. Right ventricular systolic function is normal. Left Atrium: Left atrial size was normal in size. Right Atrium: Right atrial size was normal in size. Pericardium: There is no evidence of pericardial effusion. Mitral Valve: The mitral valve is normal in structure. Trivial mitral valve regurgitation. No evidence of mitral valve stenosis. Tricuspid Valve: The tricuspid valve is normal in structure. Tricuspid valve regurgitation is trivial. No evidence of tricuspid stenosis. Aortic Valve: The aortic valve is normal in structure. Aortic valve regurgitation is not visualized. No aortic stenosis is present. Pulmonic Valve: The pulmonic valve was not well visualized. Pulmonic valve regurgitation is trivial. No evidence of pulmonic stenosis. Aorta: The aortic root and ascending aorta are structurally normal, with no evidence of dilitation. Venous: The inferior vena cava is normal in size  with greater than 50% respiratory variability, suggesting right atrial pressure of 3 mmHg. IAS/Shunts: The atrial septum is grossly normal.  LEFT VENTRICLE PLAX 2D LVIDd:         3.90 cm     Diastology LVIDs:         2.90 cm     LV e' medial:    10.80 cm/s LV PW:         1.30 cm     LV E/e' medial:  7.4 LV IVS:        0.85 cm     LV e' lateral:   7.72 cm/s LVOT diam:     1.90 cm     LV E/e' lateral: 10.4 LV SV:         48 LV SV Index:   28 LVOT Area:     2.84 cm  LV Volumes (MOD) LV vol d, MOD A2C: 61.0 ml LV vol d, MOD A4C: 66.7 ml LV vol s, MOD A2C: 28.6 ml LV vol s, MOD A4C: 28.9 ml LV SV MOD A2C:     32.4 ml LV SV MOD A4C:     66.7 ml LV SV MOD BP:      37.0 ml RIGHT VENTRICLE            IVC RV S prime:     9.79 cm/s  IVC diam: 1.10 cm TAPSE (M-mode): 2.0 cm LEFT ATRIUM             Index       RIGHT ATRIUM          Index LA diam:        2.80 cm 1.67 cm/m  RA Area:     7.31 cm LA Vol (A2C):   21.5 ml 12.86 ml/m RA Volume:   11.20 ml 6.70 ml/m LA Vol (A4C):   17.5 ml 10.47 ml/m LA Biplane Vol: 21.5 ml 12.86 ml/m  AORTIC VALVE LVOT Vmax:   79.10 cm/s LVOT Vmean:  53.900 cm/s LVOT VTI:    0.168 m  AORTA Ao Root diam: 3.00 cm Ao Asc diam:  3.40 cm MITRAL VALVE MV Area (PHT): 3.00 cm    SHUNTS MV Decel Time: 253 msec    Systemic VTI:  0.17 m MV E velocity: 80.00 cm/s  Systemic Diam: 1.90 cm MV A velocity: 64.50 cm/s MV E/A ratio:  1.24 Sunit Tolia DO Electronically signed by Tessa LernerSunit Tolia DO Signature Date/Time: 01/07/2021/9:06:59 AM    Final  FOLLOW UP PLANS AND APPOINTMENTS Discharge Instructions    AMB Referral to Cardiac Rehabilitation - Phase II   Complete by: As directed    Diagnosis: Coronary Stents   After initial evaluation and assessments completed: Virtual Based Care may be provided alone or in conjunction with Phase 2 Cardiac Rehab based on patient barriers.: Yes   Diet - low sodium heart healthy   Complete by: As directed    Increase activity slowly   Complete by: As directed       Allergies as of 01/07/2021   No Known Allergies     Medication List    TAKE these medications   aspirin EC 81 MG tablet Take 1 tablet (81 mg total) by mouth daily. Swallow whole. What changed: when to take this   atorvastatin 80 MG tablet Commonly known as: LIPITOR Take 1 tablet (80 mg total) by mouth at bedtime. What changed:   medication strength  how much to take   estradiol 0.1 MG/24HR patch Commonly known as: VIVELLE-DOT APPLY 1 PATCH TWICE WEEKLY. What changed:   how much to take  how to take this  when to take this  additional instructions   losartan 50 MG tablet Commonly known as: COZAAR TAKE 1 TABLET BY MOUTH DAILY. What changed: when to take this   metoprolol succinate 25 MG 24 hr tablet Commonly known as: Toprol XL Take 1 tablet (25 mg total) by mouth daily. What changed:   how much to take  when to take this   naratriptan 2.5 MG tablet Commonly known as: AMERGE Take one (1) tablet at onset of headache; if returns or does not resolve, may repeat after 4 hours; do not exceed five (5) mg in 24 hours. What changed:   how much to take  how to take this  when to take this  reasons to take this   nitroGLYCERIN 0.4 MG SL tablet Commonly known as: Nitrostat Place 1 tablet (0.4 mg total) under the tongue every 5 (five) minutes as needed for chest pain. If you require more than two tablets five minutes apart go to the nearest ER via EMS.   progesterone 100 MG capsule Commonly known as: PROMETRIUM Take 1 capsule (100 mg total) by mouth at bedtime.   ticagrelor 90 MG Tabs tablet Commonly known as: Brilinta Take 1 tablet (90 mg total) by mouth 2 (two) times daily.   Vitamin D-3 125 MCG (5000 UT) Tabs Take 5,000 Units by mouth at bedtime.          Elder Negus, MD Pager: [phone] Office: [phone]

## 2021-01-09 ENCOUNTER — Ambulatory Visit (INDEPENDENT_AMBULATORY_CARE_PROVIDER_SITE_OTHER): Payer: BC Managed Care – PPO | Admitting: Family Medicine

## 2021-01-09 ENCOUNTER — Encounter: Payer: Self-pay | Admitting: Family Medicine

## 2021-01-09 ENCOUNTER — Other Ambulatory Visit (HOSPITAL_BASED_OUTPATIENT_CLINIC_OR_DEPARTMENT_OTHER): Payer: Self-pay

## 2021-01-09 ENCOUNTER — Other Ambulatory Visit: Payer: Self-pay

## 2021-01-09 VITALS — BP 122/72 | HR 62 | Resp 16 | Ht 60.0 in | Wt 154.0 lb

## 2021-01-09 DIAGNOSIS — Z09 Encounter for follow-up examination after completed treatment for conditions other than malignant neoplasm: Secondary | ICD-10-CM | POA: Diagnosis not present

## 2021-01-09 DIAGNOSIS — I2510 Atherosclerotic heart disease of native coronary artery without angina pectoris: Secondary | ICD-10-CM | POA: Diagnosis not present

## 2021-01-09 DIAGNOSIS — F419 Anxiety disorder, unspecified: Secondary | ICD-10-CM | POA: Diagnosis not present

## 2021-01-09 DIAGNOSIS — K219 Gastro-esophageal reflux disease without esophagitis: Secondary | ICD-10-CM

## 2021-01-09 DIAGNOSIS — I2584 Coronary atherosclerosis due to calcified coronary lesion: Secondary | ICD-10-CM

## 2021-01-09 MED ORDER — ALPRAZOLAM 0.25 MG PO TABS: 0.2500 mg | ORAL_TABLET | Freq: Two times a day (BID) | ORAL | 0 refills | Status: DC | PRN

## 2021-01-09 MED ORDER — ALPRAZOLAM 0.25 MG PO TABS
ORAL_TABLET | ORAL | 0 refills | Status: DC
  Filled 2021-01-09: qty 10, 5d supply, fill #0

## 2021-01-09 MED ORDER — PANTOPRAZOLE SODIUM 40 MG PO TBEC
40.0000 mg | DELAYED_RELEASE_TABLET | Freq: Every day | ORAL | 3 refills | Status: DC
  Filled 2021-01-09: qty 30, 30d supply, fill #0

## 2021-01-16 ENCOUNTER — Telehealth (HOSPITAL_COMMUNITY): Payer: Self-pay

## 2021-01-16 NOTE — Telephone Encounter (Signed)
Per phase I, fax pt cardiac rehab referral to [facility]. 

## 2021-01-17 NOTE — Progress Notes (Signed)
Date:  01/18/2021   ID:  Theresa Bass, DOB 1966/01/09, MRN [id number]  PCP:  Pearline Cables, MD  Cardiologist:  Tessa Lerner, DO, [facility] (established care 01/02/2021)  Date: 01/18/21 Last Office Visit: 01/02/2021  Chief Complaint  Patient presents with  . Chest Pain  . Post cath   . Follow-up    HPI  Theresa Bass is a 55 y.o. female who presents to the office with a chief complaint of " reevaluation of chest pain and post heart catheterization follow-up." Patient's past medical history and cardiovascular risk factors include: Premature CAD status post PCI to the LAD (April 2022), postmenopausal female, hypertension, on hormone replacement therapy, family history of premature coronary artery disease.  She is referred to the office at the request of Copland, Theresa Found, MD for evaluation of abnormal ECG.  At the last office visit patient's symptoms were very concerning for angina pectoris and she was scheduled for outpatient heart catheterization.  However, due to progressiveness of the symptoms she presented to the hospital for further evaluation sooner than her scheduled cath.  Due to unstable angina patient was hospitalized at [facility] and subsequently went to Cath Lab and was Bass to have obstructive CAD in the LAD distribution.  Patient underwent angioplasty and stenting without any complications and was sent home on antianginals/guideline directed medical therapy  She now presents for follow-up.  Since discharge patient states that she is doing well from a cardiovascular standpoint.  She does have some chest discomfort with ambulation especially going uphill but nothing compared to her symptoms prior to the cath.  Patient is compliant with her medical therapy.  But prefers not to be on Brilinta due to it being cost prohibitive.  She is awaiting to start cardiac rehab.  She is also under a lot of stress with regards to taking care of her father.  Family history of premature  coronary artery disease: Mom at the age of 60 had 2 PCI's performed due to an abnormal stress test.  FUNCTIONAL STATUS: She walks at least 25 minutes twice a day and lives a very active lifestyle.  ALLERGIES: No Known Allergies  MEDICATION LIST PRIOR TO VISIT: Current Meds  Medication Sig  . ALPRAZolam (XANAX) 0.25 MG tablet Take 1 tablet (0.25 mg total) by mouth 2 (two) times daily as needed for anxiety.  Marland Kitchen aspirin EC 81 MG tablet Take 1 tablet (81 mg total) by mouth daily. Swallow whole. (Patient taking differently: Take 81 mg by mouth at bedtime. Swallow whole.)  . atorvastatin (LIPITOR) 80 MG tablet Take 1 tablet (80 mg total) by mouth at bedtime.  . Cholecalciferol (VITAMIN D-3) 125 MCG (5000 UT) TABS Take 5,000 Units by mouth at bedtime.  . clopidogrel (PLAVIX) 75 MG tablet Take 1 tablet (75 mg total) by mouth daily.  . empagliflozin (JARDIANCE) 10 MG TABS tablet Take 1 tablet (10 mg total) by mouth daily before breakfast.  . estradiol (VIVELLE-DOT) 0.1 MG/24HR patch APPLY 1 PATCH TWICE WEEKLY. (Patient taking differently: Place 1 patch onto the skin 2 (two) times a week.)  . losartan (COZAAR) 50 MG tablet TAKE 1 TABLET BY MOUTH DAILY. (Patient taking differently: Take 50 mg by mouth at bedtime.)  . metoprolol succinate (TOPROL XL) 25 MG 24 hr tablet Take 1 tablet (25 mg total) by mouth daily. (Patient taking differently: Take 12.5 mg by mouth in the morning and at bedtime.)  . naratriptan (AMERGE) 2.5 MG tablet Take one (1) tablet at onset of headache; if  returns or does not resolve, may repeat after 4 hours; do not exceed five (5) mg in 24 hours. (Patient taking differently: Take 2.5 mg by mouth 2 (two) times daily as needed for migraine. Take one (1) tablet at onset of headache; if returns or does not resolve, may repeat after 4 hours; do not exceed five (5) mg in 24 hours.)  . nitroGLYCERIN (NITROSTAT) 0.4 MG SL tablet Place 1 tablet (0.4 mg total) under the tongue every 5 (five)  minutes as needed for chest pain. If you require more than two tablets five minutes apart go to the nearest ER via EMS.  . pantoprazole (PROTONIX) 40 MG tablet Take 1 tablet (40 mg total) by mouth daily.  . progesterone (PROMETRIUM) 100 MG capsule Take 1 capsule (100 mg total) by mouth at bedtime.  . ticagrelor (BRILINTA) 90 MG TABS tablet Take 1 tablet (90 mg total) by mouth 2 (two) times daily.     PAST MEDICAL HISTORY: Past Medical History:  Diagnosis Date  . Hypertension   . Migraine     PAST SURGICAL HISTORY: Past Surgical History:  Procedure Laterality Date  . CESAREAN SECTION     x 4  . CORONARY STENT INTERVENTION N/A 01/07/2021   Procedure: CORONARY STENT INTERVENTION;  Surgeon: Elder Negus, MD;  Location: [facility] INVASIVE CV LAB;  Service: Cardiovascular;  Laterality: N/A;  . ELBOW SURGERY    . HYSTEROSCOPY    . INTRAVASCULAR ULTRASOUND/IVUS N/A 01/07/2021   Procedure: Intravascular Ultrasound/IVUS;  Surgeon: Elder Negus, MD;  Location: [facility] INVASIVE CV LAB;  Service: Cardiovascular;  Laterality: N/A;  . LEFT HEART CATH AND CORONARY ANGIOGRAPHY N/A 01/07/2021   Procedure: LEFT HEART CATH AND CORONARY ANGIOGRAPHY;  Surgeon: Elder Negus, MD;  Location: [facility] INVASIVE CV LAB;  Service: Cardiovascular;  Laterality: N/A;  . PELVIC LAPAROSCOPY    . TUBAL LIGATION      FAMILY HISTORY: The patient family history includes Heart disease in her mother; Heart failure in her father; Hypertension in her brother, father, mother, and sister.  SOCIAL HISTORY:  The patient  reports that she has never smoked. She has never used smokeless tobacco. She reports previous alcohol use. She reports that she does not use drugs.  REVIEW OF SYSTEMS: Review of Systems  Constitutional: Negative for chills and fever.  HENT: Negative for hoarse voice and nosebleeds.   Eyes: Negative for discharge, double vision and pain.  Cardiovascular: Positive for chest pain (improved). Negative for  claudication, dyspnea on exertion, leg swelling, near-syncope, orthopnea, palpitations, paroxysmal nocturnal dyspnea and syncope.  Respiratory: Negative for hemoptysis and shortness of breath.   Musculoskeletal: Negative for muscle cramps and myalgias.  Gastrointestinal: Negative for abdominal pain, constipation, diarrhea, heartburn, hematemesis, hematochezia, melena, nausea and vomiting.  Neurological: Negative for dizziness and light-headedness.   PHYSICAL EXAM: Vitals with BMI 01/18/2021 01/09/2021 01/07/2021  Height 5\' 0"  5\' 0"  -  Weight 152 lbs 10 oz 154 lbs -  BMI 29.8 30.08 -  Systolic 152 122  Diastolic 85 72 77  Pulse 76 62 87    CONSTITUTIONAL: Well-developed and well-nourished. No acute distress.  SKIN: Skin is warm and dry. No rash noted. No cyanosis. No pallor. No jaundice HEAD: Normocephalic and atraumatic.  EYES: No scleral icterus MOUTH/THROAT: Moist oral membranes.  NECK: No JVD present. No thyromegaly noted. No carotid bruits  LYMPHATIC: No visible cervical adenopathy.  CHEST Normal respiratory effort. No intercostal retractions  LUNGS: Clear to auscultation bilaterally. No stridor. No wheezes.  No rales.  CARDIOVASCULAR: Regular rate and rhythm, positive S1-S2, no murmurs rubs or gallops appreciated. ABDOMINAL: No apparent ascites.  EXTREMITIES: No peripheral edema.  Right radial site is well-healed, no bruits.  Ecchymosis noted over the left and right triceps area. HEMATOLOGIC: No significant bruising NEUROLOGIC: Oriented to person, place, and time. Nonfocal. Normal muscle tone.  PSYCHIATRIC: Normal mood and affect. Normal behavior. Cooperative  CARDIAC DATABASE: EKG: 01/18/2021: Normal sinus rhythm, 66 bpm, old anterior infarct, diffuse T wave inversions, without underlying injury pattern.  Similar to prior EKG.  Echocardiogram: 01/06/2021: LVEF 50-55%, with regional wall motion abnormalities, grade 1 diastolic impairment, trivial MR.  Stress Testing: No  results Bass for this or any previous visit from the past 1095 days.  Heart Catheterization: 01/07/2021:  LM: Normal LAD: Mid 95% stenosis LCx: Normal RCA: Normal  Successful IVUS guided percutaneous coronary intervention mid LAD PTCA and stent placement 3.5 X 20 mm Synergy drug-eluting stent 0% residual stenosis  LABORATORY DATA: CBC Latest Ref Rng & Units 01/07/2021 01/06/2021 01/05/2021  WBC 4.0 - 10.5 K/uL 5.6 6.6 7.2  Hemoglobin 12.0 - 15.0 g/dL 79.1 50.5 69.7  Hematocrit 36.0 - 46.0 % 37.4 37.6 39.7  Platelets 150 - 400 K/uL 240 287 282    CMP Latest Ref Rng & Units 01/07/2021 01/05/2021 01/03/2021  Glucose 70 - 99 mg/dL 88 948(A) 78  BUN 6 - 20 mg/dL 6 15 9   Creatinine 0.44 - 1.00 mg/dL 1.65 5.37  Sodium 135 - 145 mmol/L 137 135 140  Potassium 3.5 - 5.1 mmol/L 3.9 3.3(L) 4.2  Chloride 98 - 111 mmol/L 107 99 101  CO2 22 - 32 mmol/L 27 29 24   Calcium 8.9 - 10.3 mg/dL 8.2(L) 8.9 9.6  Total Protein 6.5 - 8.1 g/dL - 7.1 -  Total Bilirubin 0.3 - 1.2 mg/dL - 4.82) -  Alkaline Phos 38 - 126 U/L - 71 -  AST 15 - 41 U/L - 22 -  ALT 0 - 44 U/L - 20 -    Lipid Panel  Lab Results  Component Value Date   CHOL 196 01/03/2021   HDL 63 01/03/2021   LDLCALC 106 (H) 01/03/2021   LDLDIRECT 105 (H) 01/03/2021   TRIG 156 (H) 01/03/2021   CHOLHDL 3 01/02/2020   No components Bass for: NTPROBNP No results for input(s): PROBNP in the last 8760 hours. Recent Labs    01/06/21 1620  TSH 1.793    BMP Recent Labs    01/03/21 0737 01/05/21 2230 01/07/21 0615  NA 140 135 137  K 4.2 3.3* 3.9  CL 101 99 107  CO2 24 29 27   GLUCOSE 78 126* 88  BUN 9 15 6   CREATININE 0.83 0.95 0.84  CALCIUM 9.6 8.9 8.2*  GFRNONAA  --  >60 >60    HEMOGLOBIN A1C Lab Results  Component Value Date   HGBA1C 5.4 01/02/2020    IMPRESSION:    ICD-10-CM   1. Atherosclerosis of native coronary artery of native heart with angina pectoris (HCC)  I25.119 EKG 12-Lead    AMB referral to cardiac  rehabilitation    clopidogrel (PLAVIX) 75 MG tablet    empagliflozin (JARDIANCE) 10 MG TABS tablet  2. History of coronary angioplasty with insertion of stent  Z95.5   3. Abnormal EKG  R94.31   4. Benign hypertension  I10   5. Family history of premature CAD  Z82.49      RECOMMENDATIONS: Jalyric Kaestner is a 55 y.o. female whose past medical  history and cardiac risk factors include: Postmenopausal female, hypertension, on hormone replacement therapy, family history of premature coronary artery disease.  Atherosclerosis of the native coronary arteries with angina pectoris: Status post angioplasty and stenting of the mid LAD. Patient continues to have minimal chest discomfort with ambulating uphill but not on the performed.  Patient states that her symptoms are significantly less in intensity compared to her angiography. Recommend antianginal therapy for now. Will put another referral in for cardiac rehab. EKG reviewed and findings noted above. Patient states that Marden Noble is cost prohibitive-but has enough medications for the first 30 days..  I have offered to do patient assistance to see if she qualifies and also provided additional 2-week sample.   Patient states that she would like to transition to Plavix after the first month. Prescription for Plavix 75 mg p.o. daily provided. Patient and her husband are notified that when she transitions from Brilinta to Plavix she will need to be loaded with 600 mg of Plavix.  So on day 1 take Plavix 75 mg 8 tablets x 1.  Starting day 2 take Plavix 75 mg p.o. daily.  Patient is asked to call the office if she needs to be reeducated. Recommend 1 year of dual antiplatelet therapy as intervention was performed in the ACS setting. We will also prescribe Jardiance 10 mg p.o. daily.  Patient states that she will take the medication if it is cost effective.  Hypertension:  Office blood pressures currently not at goal. Most likely secondary to the stress  related to cost of medications, taking care of her father, and other factors. Recommended checking her blood pressures at home.  Would recommend a systolic blood pressures of around 130 mmHg. Low-salt diet recommended Currently managed by primary care provider.  Family history of premature coronary artery disease: Continue dual antiplatelet therapy.  Educated on the importance of improving her modifiable cardiovascular risk factors such as blood pressure management, stress management, medication compliance.  FINAL MEDICATION LIST END OF ENCOUNTER: Meds ordered this encounter  Medications  . clopidogrel (PLAVIX) 75 MG tablet    Sig: Take 1 tablet (75 mg total) by mouth daily.    Dispense:  90 tablet    Refill:  1  . empagliflozin (JARDIANCE) 10 MG TABS tablet    Sig: Take 1 tablet (10 mg total) by mouth daily before breakfast.    Dispense:  90 tablet    Refill:  0    Medications Discontinued During This Encounter  Medication Reason  . ALPRAZolam (XANAX) 0.25 MG tablet Error     Current Outpatient Medications:  .  ALPRAZolam (XANAX) 0.25 MG tablet, Take 1 tablet (0.25 mg total) by mouth 2 (two) times daily as needed for anxiety., Disp: 10 tablet, Rfl: 0 .  aspirin EC 81 MG tablet, Take 1 tablet (81 mg total) by mouth daily. Swallow whole. (Patient taking differently: Take 81 mg by mouth at bedtime. Swallow whole.), Disp: 30 tablet, Rfl: 0 .  atorvastatin (LIPITOR) 80 MG tablet, Take 1 tablet (80 mg total) by mouth at bedtime., Disp: 30 tablet, Rfl: 1 .  Cholecalciferol (VITAMIN D-3) 125 MCG (5000 UT) TABS, Take 5,000 Units by mouth at bedtime., Disp: , Rfl:  .  clopidogrel (PLAVIX) 75 MG tablet, Take 1 tablet (75 mg total) by mouth daily., Disp: 90 tablet, Rfl: 1 .  empagliflozin (JARDIANCE) 10 MG TABS tablet, Take 1 tablet (10 mg total) by mouth daily before breakfast., Disp: 90 tablet, Rfl: 0 .  estradiol (VIVELLE-DOT)  0.1 MG/24HR patch, APPLY 1 PATCH TWICE WEEKLY. (Patient taking  differently: Place 1 patch onto the skin 2 (two) times a week.), Disp: 24 patch, Rfl: 4 .  losartan (COZAAR) 50 MG tablet, TAKE 1 TABLET BY MOUTH DAILY. (Patient taking differently: Take 50 mg by mouth at bedtime.), Disp: 90 tablet, Rfl: 0 .  metoprolol succinate (TOPROL XL) 25 MG 24 hr tablet, Take 1 tablet (25 mg total) by mouth daily. (Patient taking differently: Take 12.5 mg by mouth in the morning and at bedtime.), Disp: 30 tablet, Rfl: 0 .  naratriptan (AMERGE) 2.5 MG tablet, Take one (1) tablet at onset of headache; if returns or does not resolve, may repeat after 4 hours; do not exceed five (5) mg in 24 hours. (Patient taking differently: Take 2.5 mg by mouth 2 (two) times daily as needed for migraine. Take one (1) tablet at onset of headache; if returns or does not resolve, may repeat after 4 hours; do not exceed five (5) mg in 24 hours.), Disp: 9 tablet, Rfl: 1 .  nitroGLYCERIN (NITROSTAT) 0.4 MG SL tablet, Place 1 tablet (0.4 mg total) under the tongue every 5 (five) minutes as needed for chest pain. If you require more than two tablets five minutes apart go to the nearest ER via EMS., Disp: 30 tablet, Rfl: 0 .  pantoprazole (PROTONIX) 40 MG tablet, Take 1 tablet (40 mg total) by mouth daily., Disp: 30 tablet, Rfl: 3 .  progesterone (PROMETRIUM) 100 MG capsule, Take 1 capsule (100 mg total) by mouth at bedtime., Disp: 90 capsule, Rfl: 3 .  ticagrelor (BRILINTA) 90 MG TABS tablet, Take 1 tablet (90 mg total) by mouth 2 (two) times daily., Disp: 60 tablet, Rfl: 1  Orders Placed This Encounter  Procedures  . AMB referral to cardiac rehabilitation  . EKG 12-Lead    There are no Patient Instructions on file for this visit.   --Continue cardiac medications as reconciled in final medication list. --Return in about 6 months (around 07/20/2021) for Follow up, CAD. Or sooner if needed. --Continue follow-up with your primary care physician regarding the management of your other chronic comorbid  conditions.  Patient's questions and concerns were addressed to her satisfaction. She voices understanding of the instructions provided during this encounter.   This note was created using a voice recognition software as a result there may be grammatical errors inadvertently enclosed that do not reflect the nature of this encounter. Every attempt is made to correct such errors.  Tessa LernerSunit Efstathios Sawin, [place]DO, North Alabama Regional HospitalFACC  Pager: [phone] Office: [phone]

## 2021-01-18 ENCOUNTER — Other Ambulatory Visit: Payer: Self-pay

## 2021-01-18 ENCOUNTER — Ambulatory Visit: Payer: BC Managed Care – PPO | Admitting: Cardiology

## 2021-01-18 ENCOUNTER — Encounter: Payer: Self-pay | Admitting: Cardiology

## 2021-01-18 VITALS — BP 152/85 | HR 76 | Temp 98.3°F | Resp 16 | Ht 60.0 in | Wt 152.6 lb

## 2021-01-18 DIAGNOSIS — Z8249 Family history of ischemic heart disease and other diseases of the circulatory system: Secondary | ICD-10-CM

## 2021-01-18 DIAGNOSIS — I10 Essential (primary) hypertension: Secondary | ICD-10-CM

## 2021-01-18 DIAGNOSIS — I25119 Atherosclerotic heart disease of native coronary artery with unspecified angina pectoris: Secondary | ICD-10-CM

## 2021-01-18 DIAGNOSIS — Z955 Presence of coronary angioplasty implant and graft: Secondary | ICD-10-CM | POA: Diagnosis not present

## 2021-01-18 DIAGNOSIS — I209 Angina pectoris, unspecified: Secondary | ICD-10-CM

## 2021-01-18 DIAGNOSIS — R9431 Abnormal electrocardiogram [ECG] [EKG]: Secondary | ICD-10-CM | POA: Diagnosis not present

## 2021-01-18 MED ORDER — CLOPIDOGREL BISULFATE 75 MG PO TABS: 75.0000 mg | ORAL_TABLET | Freq: Every day | ORAL | 1 refills | Status: DC

## 2021-01-18 MED ORDER — EMPAGLIFLOZIN 10 MG PO TABS: 10.0000 mg | ORAL_TABLET | Freq: Every day | ORAL | 0 refills | Status: DC

## 2021-01-21 ENCOUNTER — Ambulatory Visit: Payer: BC Managed Care – PPO | Admitting: Gastroenterology

## 2021-01-30 ENCOUNTER — Other Ambulatory Visit: Payer: Self-pay | Admitting: Family Medicine

## 2021-01-30 ENCOUNTER — Other Ambulatory Visit: Payer: Self-pay | Admitting: Cardiology

## 2021-01-30 ENCOUNTER — Other Ambulatory Visit: Payer: Self-pay

## 2021-01-30 DIAGNOSIS — I209 Angina pectoris, unspecified: Secondary | ICD-10-CM

## 2021-01-30 DIAGNOSIS — I10 Essential (primary) hypertension: Secondary | ICD-10-CM

## 2021-01-30 MED ORDER — ATORVASTATIN CALCIUM 80 MG PO TABS: 80.0000 mg | ORAL_TABLET | Freq: Every day | ORAL | 5 refills | Status: DC

## 2021-02-12 ENCOUNTER — Other Ambulatory Visit: Payer: Self-pay

## 2021-02-12 DIAGNOSIS — I25119 Atherosclerotic heart disease of native coronary artery with unspecified angina pectoris: Secondary | ICD-10-CM

## 2021-02-12 MED ORDER — TICAGRELOR 90 MG PO TABS: 90.0000 mg | ORAL_TABLET | Freq: Two times a day (BID) | ORAL | 3 refills | Status: DC

## 2021-02-12 MED ORDER — EMPAGLIFLOZIN 10 MG PO TABS: 10.0000 mg | ORAL_TABLET | Freq: Every day | ORAL | 3 refills | Status: DC

## 2021-02-14 DIAGNOSIS — R9389 Abnormal findings on diagnostic imaging of other specified body structures: Secondary | ICD-10-CM | POA: Diagnosis not present

## 2021-02-14 DIAGNOSIS — N950 Postmenopausal bleeding: Secondary | ICD-10-CM | POA: Diagnosis not present

## 2021-02-18 ENCOUNTER — Telehealth: Payer: Self-pay

## 2021-02-19 NOTE — Telephone Encounter (Signed)
Called OB/GYN office to discuss cardiovascular concerns regarding holding antiplatelet therapy in view of recent LAD stent in 12/2020. The office states patient had already called and advised she would like to reschedule [facility] for 2 months from now, agree with this and advised further follow up with Dr. Odis Hollingshead.

## 2021-02-19 NOTE — Telephone Encounter (Signed)
Thanks agree.

## 2021-02-23 ENCOUNTER — Emergency Department (HOSPITAL_BASED_OUTPATIENT_CLINIC_OR_DEPARTMENT_OTHER): Payer: BC Managed Care – PPO

## 2021-02-23 ENCOUNTER — Other Ambulatory Visit: Payer: Self-pay

## 2021-02-23 ENCOUNTER — Emergency Department (HOSPITAL_BASED_OUTPATIENT_CLINIC_OR_DEPARTMENT_OTHER)
Admission: EM | Admit: 2021-02-23 | Discharge: 2021-02-24 | Disposition: A | Discharge status: Home / Self Care | Payer: BC Managed Care – PPO | Attending: Emergency Medicine | Admitting: Emergency Medicine

## 2021-02-23 ENCOUNTER — Encounter (HOSPITAL_BASED_OUTPATIENT_CLINIC_OR_DEPARTMENT_OTHER): Payer: Self-pay | Admitting: Emergency Medicine

## 2021-02-23 DIAGNOSIS — Z7902 Long term (current) use of antithrombotics/antiplatelets: Secondary | ICD-10-CM | POA: Insufficient documentation

## 2021-02-23 DIAGNOSIS — I10 Essential (primary) hypertension: Secondary | ICD-10-CM | POA: Diagnosis not present

## 2021-02-23 DIAGNOSIS — R0602 Shortness of breath: Secondary | ICD-10-CM | POA: Insufficient documentation

## 2021-02-23 DIAGNOSIS — K30 Functional dyspepsia: Secondary | ICD-10-CM | POA: Insufficient documentation

## 2021-02-23 DIAGNOSIS — R079 Chest pain, unspecified: Secondary | ICD-10-CM | POA: Insufficient documentation

## 2021-02-23 DIAGNOSIS — R072 Precordial pain: Secondary | ICD-10-CM | POA: Diagnosis not present

## 2021-02-23 DIAGNOSIS — Z79899 Other long term (current) drug therapy: Secondary | ICD-10-CM | POA: Insufficient documentation

## 2021-02-23 DIAGNOSIS — Z8679 Personal history of other diseases of the circulatory system: Secondary | ICD-10-CM

## 2021-02-23 DIAGNOSIS — R110 Nausea: Secondary | ICD-10-CM | POA: Insufficient documentation

## 2021-02-23 LAB — COMPREHENSIVE METABOLIC PANEL
ALT: 24 U/L (ref 0–44)
AST: 23 U/L (ref 15–41)
Albumin: 4.5 g/dL (ref 3.5–5.0)
Alkaline Phosphatase: 87 U/L (ref 38–126)
Anion gap: 10 (ref 5–15)
BUN: 13 mg/dL (ref 6–20)
CO2: 25 mmol/L (ref 22–32)
Calcium: 9.5 mg/dL (ref 8.9–10.3)
Chloride: 105 mmol/L (ref 98–111)
Creatinine, Ser: 0.91 mg/dL (ref 0.44–1.00)
GFR, Estimated: >60 mL/min (ref >60)
Glucose, Bld: 112 mg/dL — ABNORMAL HIGH (ref 70–99)
Potassium: 3.6 mmol/L (ref 3.5–5.1)
Sodium: 140 mmol/L (ref 135–145)
Total Bilirubin: 0.4 mg/dL (ref 0.3–1.2)
Total Protein: 8 g/dL (ref 6.5–8.1)

## 2021-02-23 LAB — TROPONIN I (HIGH SENSITIVITY): Troponin I (High Sensitivity): 6 ng/L (ref <18)

## 2021-02-23 LAB — CBC
HCT: 34.1 % — ABNORMAL LOW (ref 36.0–46.0)
Hemoglobin: 10.8 g/dL — ABNORMAL LOW (ref 12.0–15.0)
MCH: 26.3 pg (ref 26.0–34.0)
MCHC: 31.7 g/dL (ref 30.0–36.0)
MCV: 83.2 fL (ref 80.0–100.0)
Platelets: 391 10*3/uL (ref 150–400)
RBC: 4.1 MIL/uL (ref 3.87–5.11)
RDW: 12.9 % (ref 11.5–15.5)
WBC: 7 10*3/uL (ref 4.0–10.5)
nRBC: 0 % (ref 0.0–0.2)

## 2021-02-23 MED ORDER — ONDANSETRON HCL 4 MG/2ML IJ SOLN
4.0000 mg | Freq: Once | INTRAMUSCULAR | Status: AC
  Administered 2021-02-23: 4 mg via INTRAVENOUS
  Filled 2021-02-23: qty 2

## 2021-02-23 MED ORDER — ASPIRIN 81 MG PO CHEW
324.0000 mg | CHEWABLE_TABLET | Freq: Once | ORAL | Status: AC
  Administered 2021-02-23: 324 mg via ORAL
  Filled 2021-02-23: qty 4

## 2021-02-23 MED ORDER — MORPHINE SULFATE (PF) 4 MG/ML IV SOLN
4.0000 mg | Freq: Once | INTRAVENOUS | Status: AC
  Administered 2021-02-23: 4 mg via INTRAVENOUS
  Filled 2021-02-23: qty 1

## 2021-02-23 MED ORDER — FAMOTIDINE 20 MG PO TABS
20.0000 mg | ORAL_TABLET | Freq: Once | ORAL | Status: AC
  Administered 2021-02-23: 20 mg via ORAL
  Filled 2021-02-23: qty 1

## 2021-02-23 MED ORDER — NITROGLYCERIN IN D5W 200-5 MCG/ML-% IV SOLN
0.0000 ug/min | INTRAVENOUS | Status: DC
  Administered 2021-02-23: 5 ug/min via INTRAVENOUS
  Filled 2021-02-23: qty 250

## 2021-02-23 MED ORDER — ALUM & MAG HYDROXIDE-SIMETH 200-200-20 MG/5ML PO SUSP
30.0000 mL | Freq: Once | ORAL | Status: AC
  Administered 2021-02-23: 30 mL via ORAL
  Filled 2021-02-23: qty 30

## 2021-02-23 NOTE — ED Notes (Signed)
EDP at bedside  

## 2021-02-23 NOTE — ED Triage Notes (Signed)
Reports burning substernal chest pain that started around 1930 and nausea. She states recent cardiac stent placement 01/07/21 and felt similar to today at that time. Pt states she also took one sl nitro and felt some improvement but pain returned. EDP at bedside during triage, EKG performed on arrival to room and PIV placed.

## 2021-02-23 NOTE — ED Notes (Signed)
Called lab about orders being placed for blood in lab

## 2021-02-23 NOTE — Discharge Instructions (Addendum)
It was our pleasure to provide your ER care today - we hope that you feel better.  Continue your home meds, including aspirin, brilinta, and protonix. If gi symptoms, you may also try pepcid and maalox for symptom relief.   Follow up with your cardiologist in two days time - call office to arrange appointment.   Return to [facility] ER right away if worse, new symptoms, recurrent or persistent chest pain, trouble breathing, weak/faint, or other concern.

## 2021-02-23 NOTE — ED Provider Notes (Signed)
[facility] EMERGENCY DEPARTMENT Provider Note   CSN: [id number] Arrival date & time: 02/23/21  2121     History Chief Complaint  Patient presents with  . Chest Pain    Theresa Bass is a 55 y.o. female.  Patient w hx cad, stent/cath last month, c/o dull, indigestion type of pain mid to upper chest, with mild sob, nausea, acute onset at rest this evening. States similar to symptoms prior to recent cath. States compliant w home meds, has not taken her asa yet today. No cough or uri symptoms. No fever or chills. No pleuritic pain. No leg pain or swelling. States prior to cath had been having indigestion and reflux type symptoms that she thought were gi in nature, but states ended up being cad. Since cath, no other recent chest discomfort before today.   The history is provided by the patient and the spouse.  Chest Pain Associated symptoms: nausea and shortness of breath   Associated symptoms: no abdominal pain, no back pain, no cough, no fever, no headache, no palpitations and no vomiting        Past Medical History:  Diagnosis Date  . Hypertension   . Migraine     Patient Active Problem List   Diagnosis Date Noted  . Unstable angina (HCC) 01/05/2021  . Migraine     Past Surgical History:  Procedure Laterality Date  . CESAREAN SECTION     x 4  . CORONARY STENT INTERVENTION N/A 01/07/2021   Procedure: CORONARY STENT INTERVENTION;  Surgeon: Elder Negus, MD;  Location: [facility];  Service: Cardiovascular;  Laterality: N/A;  . ELBOW SURGERY    . HYSTEROSCOPY    . INTRAVASCULAR ULTRASOUND/IVUS N/A 01/07/2021   Procedure: Intravascular Ultrasound/IVUS;  Surgeon: Elder Negus, MD;  Location: [facility];  Service: Cardiovascular;  Laterality: N/A;  . LEFT HEART CATH AND CORONARY ANGIOGRAPHY N/A 01/07/2021   Procedure: LEFT HEART CATH AND CORONARY ANGIOGRAPHY;  Surgeon: Elder Negus, MD;  Location: [facility];  Service:  Cardiovascular;  Laterality: N/A;  . PELVIC LAPAROSCOPY    . TUBAL LIGATION       OB History    Gravida  5   Para  4   Term      Preterm      AB  1   Living  4     SAB      IAB      Ectopic      Multiple      Live Births              Family History  Problem Relation Age of Onset  . Hypertension Mother   . Heart disease Mother   . Hypertension Father   . Heart failure Father   . Hypertension Brother   . Hypertension Sister     Social History   Tobacco Use  . Smoking status: Never Smoker  . Smokeless tobacco: Never Used  Vaping Use  . Vaping Use: Never used  Substance Use Topics  . Alcohol use: Not Currently    Alcohol/week: 0.0 standard drinks    Comment: Rare  . Drug use: No    Home Medications Prior to Admission medications   Medication Sig Start Date End Date Taking? Authorizing Provider  ALPRAZolam (XANAX) 0.25 MG tablet Take 1 tablet (0.25 mg total) by mouth 2 (two) times daily as needed for anxiety. 01/09/21   Copland, Gwenlyn Found, MD  atorvastatin (LIPITOR)  80 MG tablet Take 1 tablet (80 mg total) by mouth at bedtime. 01/30/21   Tolia, Sunit, DO  Cholecalciferol (VITAMIN D-3) 125 MCG (5000 UT) TABS Take 5,000 Units by mouth at bedtime.    [provider]  clopidogrel (PLAVIX) 75 MG tablet Take 1 tablet (75 mg total) by mouth daily. 01/18/21 07/17/21  Tolia, Sunit, DO  empagliflozin (JARDIANCE) 10 MG TABS tablet Take 1 tablet (10 mg total) by mouth daily before breakfast. 02/12/21 05/13/21  Tolia, Sunit, DO  estradiol (VIVELLE-DOT) 0.1 MG/24HR patch APPLY 1 PATCH TWICE WEEKLY. Patient taking differently: Place 1 patch onto the skin 2 (two) times a week. 11/11/18   Fontaine, Nadyne Coombesimothy P, MD  losartan (COZAAR) 50 MG tablet TAKE 1 TABLET BY MOUTH DAILY. 01/30/21   Copland, Gwenlyn FoundJessica C, MD  metoprolol succinate (TOPROL-XL) 25 MG 24 hr tablet TAKE ONE TABLET BY MOUTH DAILY 01/30/21   Tolia, Sunit, DO  naratriptan (AMERGE) 2.5 MG tablet Take one (1) tablet  at onset of headache; if returns or does not resolve, may repeat after 4 hours; do not exceed five (5) mg in 24 hours. Patient taking differently: Take 2.5 mg by mouth 2 (two) times daily as needed for migraine. Take one (1) tablet at onset of headache; if returns or does not resolve, may repeat after 4 hours; do not exceed five (5) mg in 24 hours. 11/11/18   Fontaine, Nadyne Coombesimothy P, MD  nitroGLYCERIN (NITROSTAT) 0.4 MG SL tablet Place 1 tablet (0.4 mg total) under the tongue every 5 (five) minutes as needed for chest pain. If you require more than two tablets five minutes apart go to the nearest ER via EMS. 01/02/21 02/01/21  Tolia, Sunit, DO  pantoprazole (PROTONIX) 40 MG tablet Take 1 tablet (40 mg total) by mouth daily. 01/09/21   Copland, Gwenlyn FoundJessica C, MD  progesterone (PROMETRIUM) 100 MG capsule Take 1 capsule (100 mg total) by mouth at bedtime. 12/09/17   Fontaine, Nadyne Coombesimothy P, MD  ticagrelor (BRILINTA) 90 MG TABS tablet Take 1 tablet (90 mg total) by mouth 2 (two) times daily. 02/12/21   Tolia, Sunit, DO    Allergies    Patient has no known allergies.  Review of Systems   Review of Systems  Constitutional: Negative for chills and fever.  HENT: Negative for sore throat.   Eyes: Negative for redness.  Respiratory: Positive for shortness of breath. Negative for cough.   Cardiovascular: Positive for chest pain. Negative for palpitations and leg swelling.  Gastrointestinal: Positive for nausea. Negative for abdominal pain and vomiting.  Genitourinary: Negative for flank pain.  Musculoskeletal: Negative for back pain and neck pain.  Skin: Negative for rash.  Neurological: Negative for headaches.  Hematological: Does not bruise/bleed easily.  Psychiatric/Behavioral: Negative for confusion.    Physical Exam Updated Vital Signs BP (!) 171/95 (BP Location: Right Arm)   Pulse 80   Temp 99 F (37.2 C) (Oral)   Resp 19   LMP 12/22/2020 (Approximate) Comment: states she takes HRT  SpO2 97%   Physical  Exam Vitals and nursing note reviewed.  Constitutional:      Appearance: Normal appearance. She is well-developed.  HENT:     Head: Atraumatic.     Nose: Nose normal.     Mouth/Throat:     Mouth: Mucous membranes are moist.  Eyes:     General: No scleral icterus.    Conjunctiva/sclera: Conjunctivae normal.  Neck:     Trachea: No tracheal deviation.  Cardiovascular:  Rate and Rhythm: Normal rate and regular rhythm.     Pulses: Normal pulses.     Heart sounds: Normal heart sounds. No murmur heard. No friction rub. No gallop.   Pulmonary:     Effort: Pulmonary effort is normal. No respiratory distress.     Breath sounds: Normal breath sounds.  Chest:     Chest wall: No tenderness.  Abdominal:     General: Bowel sounds are normal. There is no distension.     Palpations: Abdomen is soft.     Tenderness: There is no abdominal tenderness.  Genitourinary:    Comments: No cva tenderness.  Musculoskeletal:        General: No swelling or tenderness.     Cervical back: Normal range of motion and neck supple. No rigidity. No muscular tenderness.     Right lower leg: No edema.     Left lower leg: No edema.  Skin:    General: Skin is warm and dry.     Findings: No rash.  Neurological:     Mental Status: She is alert.     Comments: Alert, speech normal.   Psychiatric:        Mood and Affect: Mood normal.     ED Results / Procedures / Treatments   Labs (all labs ordered are listed, but only abnormal results are displayed) Results for orders placed or performed during the hospital encounter of 02/23/21  CBC  Result Value Ref Range   WBC 7.0 4.0 - 10.5 K/uL   RBC 4.10 3.87 - 5.11 MIL/uL   Hemoglobin 10.8 (L) 12.0 - 15.0 g/dL   HCT 42.8 (L) 76.8 - 11.5 %   MCV 83.2 80.0 - 100.0 fL   MCH 26.3 26.0 - 34.0 pg   MCHC 31.7 30.0 - 36.0 g/dL   RDW 72.6 20.3 - 55.9 %   Platelets 391 150 - 400 K/uL   nRBC 0.0 0.0 - 0.2 %  Comprehensive metabolic panel  Result Value Ref Range    Sodium 140 135 - 145 mmol/L   Potassium 3.6 3.5 - 5.1 mmol/L   Chloride 105 98 - 111 mmol/L   CO2 25 22 - 32 mmol/L   Glucose, Bld 112 (H) 70 - 99 mg/dL   BUN 13 6 - 20 mg/dL   Creatinine, Ser 7.41 0.44 - 1.00 mg/dL   Calcium 9.5 8.9 - 63.8 mg/dL   Total Protein 8.0 6.5 - 8.1 g/dL   Albumin 4.5 3.5 - 5.0 g/dL   AST 23 15 - 41 U/L   ALT 24 0 - 44 U/L   Alkaline Phosphatase 87 38 - 126 U/L   Total Bilirubin 0.4 0.3 - 1.2 mg/dL   GFR, Estimated >45 >36 mL/min   Anion gap 10 5 - 15  Troponin I (High Sensitivity)  Result Value Ref Range   Troponin I (High Sensitivity) 6 <18 ng/L   DG Chest Port 1 View  Result Date: 02/23/2021 CLINICAL DATA:  Substernal chest pain, nausea EXAM: PORTABLE CHEST 1 VIEW COMPARISON:  01/05/2021 FINDINGS: The heart size and mediastinal contours are within normal limits. Both lungs are clear. The visualized skeletal structures are unremarkable. IMPRESSION: No active disease. Electronically Signed   By: Sharlet Salina M.D.   On: 02/23/2021 21:59    EKG EKG Interpretation  Date/Time:  Saturday Feb 23 2021 21:27:12 EDT Ventricular Rate:  79 PR Interval:  141 QRS Duration: 90 QT Interval:  364 QTC Calculation: 418 R Axis:   23  Text Interpretation: Sinus rhythm Non-specific ST-t changes Confirmed by Cathren Laine ([id number]) on 02/23/2021 9:49:28 PM   Radiology DG Chest Port 1 View  Result Date: 02/23/2021 CLINICAL DATA:  Substernal chest pain, nausea EXAM: PORTABLE CHEST 1 VIEW COMPARISON:  01/05/2021 FINDINGS: The heart size and mediastinal contours are within normal limits. Both lungs are clear. The visualized skeletal structures are unremarkable. IMPRESSION: No active disease. Electronically Signed   By: Sharlet Salina M.D.   On: 02/23/2021 21:59    Procedures Procedures   Medications Ordered in ED Medications  aspirin chewable tablet 324 mg (has no administration in time range)  nitroGLYCERIN 50 mg in dextrose 5 % 250 mL (0.2 mg/mL) infusion (has no  administration in time range)  morphine 4 MG/ML injection 4 mg (has no administration in time range)  ondansetron (ZOFRAN) injection 4 mg (has no administration in time range)    ED Course  I have reviewed the triage vital signs and the nursing notes.  Pertinent labs & imaging results that were available during my care of the patient were reviewed by me and considered in my medical decision making (see chart for details).    MDM Rules/Calculators/A&P                         Iv ns. Continuous pulse ox and cardiac monitoring. Stat labs. Imaging. Ecg.   Reviewed nursing notes and prior charts for additional history.   NTG gtt. Asa po. Morphine iv, zofran iv.   Labs reviewed/interpreted by me - chem normal. Initial trop normal.   CXR reviewed/interpreted by me - no pna.   Dr Patwardham//cardiology consulted.  Discussed pt with on-call cardiology, including recent cath/stent results, ecg today, etc. He indicates if delta trop normal and not increasing, feels can safely d/c to home. Indicates if bump in trop, etc, can contact medicine service for admission/obs.   Recheck pt, pain resolved.  Delta trop remains pending.   2350, signed out to Dr Read Drivers to check delta trop. If normal and not increased from initial, and symptoms remain resolved, possible d/c w close outpt cardiology f/u. If trop increased from initial or recurrent cp - admit.  Pt currently is cp free.         Final Clinical Impression(s) / ED Diagnoses Final diagnoses:  None    Rx / DC Orders ED Discharge Orders    None       Cathren Laine, MD 02/23/21 2351

## 2021-02-23 NOTE — ED Notes (Signed)
Pt reports being pain free; instructed to use call bell if pain returns; BP being monitored q69min; EDP aware

## 2021-02-24 LAB — TROPONIN I (HIGH SENSITIVITY): Troponin I (High Sensitivity): 5 ng/L (ref <18)

## 2021-02-26 DIAGNOSIS — I25119 Atherosclerotic heart disease of native coronary artery with unspecified angina pectoris: Secondary | ICD-10-CM | POA: Diagnosis not present

## 2021-02-27 ENCOUNTER — Other Ambulatory Visit: Payer: Self-pay

## 2021-02-27 ENCOUNTER — Ambulatory Visit
Admission: RE | Admit: 2021-02-27 | Discharge: 2021-02-27 | Disposition: A | Discharge status: Home / Self Care | Payer: BC Managed Care – PPO | Source: Ambulatory Visit | Attending: Family Medicine | Admitting: Family Medicine

## 2021-02-27 ENCOUNTER — Other Ambulatory Visit: Payer: Self-pay | Admitting: Family Medicine

## 2021-02-27 DIAGNOSIS — R922 Inconclusive mammogram: Secondary | ICD-10-CM | POA: Diagnosis not present

## 2021-02-27 DIAGNOSIS — N6489 Other specified disorders of breast: Secondary | ICD-10-CM

## 2021-02-27 DIAGNOSIS — N6001 Solitary cyst of right breast: Secondary | ICD-10-CM | POA: Diagnosis not present

## 2021-02-28 ENCOUNTER — Encounter: Payer: Self-pay | Admitting: Cardiology

## 2021-02-28 ENCOUNTER — Ambulatory Visit: Payer: BC Managed Care – PPO | Admitting: Cardiology

## 2021-02-28 VITALS — BP 133/83 | HR 90 | Temp 98.3°F | Resp 16 | Ht 60.0 in | Wt 148.0 lb

## 2021-02-28 DIAGNOSIS — E782 Mixed hyperlipidemia: Secondary | ICD-10-CM | POA: Diagnosis not present

## 2021-02-28 DIAGNOSIS — I10 Essential (primary) hypertension: Secondary | ICD-10-CM

## 2021-02-28 DIAGNOSIS — I2510 Atherosclerotic heart disease of native coronary artery without angina pectoris: Secondary | ICD-10-CM

## 2021-02-28 DIAGNOSIS — Z955 Presence of coronary angioplasty implant and graft: Secondary | ICD-10-CM

## 2021-02-28 DIAGNOSIS — I25119 Atherosclerotic heart disease of native coronary artery with unspecified angina pectoris: Secondary | ICD-10-CM | POA: Diagnosis not present

## 2021-02-28 DIAGNOSIS — Z8249 Family history of ischemic heart disease and other diseases of the circulatory system: Secondary | ICD-10-CM

## 2021-02-28 NOTE — Progress Notes (Signed)
Date:  02/28/2021   ID:  Theresa Bass, DOB [birth year], MRN [id number]  PCP:  Pearline Cables, MD  Cardiologist:  Tessa Lerner, DO, [facility] (established care 01/02/2021)  Date: 02/28/21 Last Office Visit: 01/18/2021  Chief Complaint  Patient presents with  . Chest Pain    ER visit    HPI  Theresa Bass is a 55 y.o. female who presents to the office with a chief complaint of " chest pain and recent ER visit." Patient's past medical history and cardiovascular risk factors include: Premature CAD status post PCI to the LAD (April 2022), postmenopausal female, hypertension, on hormone replacement therapy, family history of premature coronary artery disease.  She is referred to the office at the request of Copland, Gwenlyn Found, MD for evaluation of abnormal ECG.  Recently diagnosed with coronary artery disease status post LAD angioplasty and stenting.  Presents to the office for reevaluation after going to the ER on 02/23/2021 for chest pain.  Patient states that she was having chest discomfort and was not sure if it was secondary to indigestion or cardiac etiology.  However given the recent course of events thought it would be safer to have it reevaluated.  She had an EKG performed as well as high-sensitivity biomarkers which were negative x2.  She was sent home and referred to the office for further evaluation and management.  Since then patient denies any chest pain at rest or with effort related activities.  She has precordial cramping-like sensation at times but very short-lived and nothing similar to her anginal equivalent symptoms.  She continues to take dual antiplatelet therapy without any side effects or intolerances.  She prefers to be on Brilinta for now.   She starts cardiac rehab April 02, 2021.  And plans to have a D&C due to vaginal bleeding mid July 2022.  Family history of premature coronary artery disease: Mom at the age of 5 had 2 PCI's performed due to an abnormal stress  test.  FUNCTIONAL STATUS: She walks at least 25 minutes twice a day and lives a very active lifestyle.  ALLERGIES: No Known Allergies  MEDICATION LIST PRIOR TO VISIT: Current Meds  Medication Sig  . ALPRAZolam (XANAX) 0.25 MG tablet Take 1 tablet (0.25 mg total) by mouth 2 (two) times daily as needed for anxiety.  Marland Kitchen aspirin EC 81 MG tablet Take 81 mg by mouth daily. Swallow whole.  Marland Kitchen atorvastatin (LIPITOR) 80 MG tablet Take 1 tablet (80 mg total) by mouth at bedtime.  . Cholecalciferol (VITAMIN D-3) 125 MCG (5000 UT) TABS Take 5,000 Units by mouth at bedtime.  . empagliflozin (JARDIANCE) 10 MG TABS tablet Take 1 tablet (10 mg total) by mouth daily before breakfast.  . losartan (COZAAR) 50 MG tablet TAKE 1 TABLET BY MOUTH DAILY.  . metoprolol succinate (TOPROL-XL) 25 MG 24 hr tablet TAKE ONE TABLET BY MOUTH DAILY  . naratriptan (AMERGE) 2.5 MG tablet Take one (1) tablet at onset of headache; if returns or does not resolve, may repeat after 4 hours; do not exceed five (5) mg in 24 hours. (Patient taking differently: Take 2.5 mg by mouth 2 (two) times daily as needed for migraine. Take one (1) tablet at onset of headache; if returns or does not resolve, may repeat after 4 hours; do not exceed five (5) mg in 24 hours.)  . nitroGLYCERIN (NITROSTAT) 0.4 MG SL tablet Place 1 tablet (0.4 mg total) under the tongue every 5 (five) minutes as needed for chest pain. If  you require more than two tablets five minutes apart go to the nearest ER via EMS.  . pantoprazole (PROTONIX) 40 MG tablet Take 1 tablet (40 mg total) by mouth daily.  . ticagrelor (BRILINTA) 90 MG TABS tablet Take 1 tablet (90 mg total) by mouth 2 (two) times daily.     PAST MEDICAL HISTORY: Past Medical History:  Diagnosis Date  . Coronary artery disease   . Hyperlipidemia   . Hypertension   . Migraine     PAST SURGICAL HISTORY: Past Surgical History:  Procedure Laterality Date  . CESAREAN SECTION     x 4  . CORONARY STENT  INTERVENTION N/A 01/07/2021   Procedure: CORONARY STENT INTERVENTION;  Surgeon: Elder Negus, MD;  [facility];  Service: Cardiovascular;  Laterality: N/A;  . ELBOW SURGERY    . HYSTEROSCOPY    . INTRAVASCULAR ULTRASOUND/IVUS N/A 01/07/2021   Procedure: Intravascular Ultrasound/IVUS;  Surgeon: Elder Negus, MD;  Location: [facility];  Service: Cardiovascular;  Laterality: N/A;  . LEFT HEART CATH AND CORONARY ANGIOGRAPHY N/A 01/07/2021   Procedure: LEFT HEART CATH AND CORONARY ANGIOGRAPHY;  Surgeon: Elder Negus, MD;  Location: [facility];  Service: Cardiovascular;  Laterality: N/A;  . PELVIC LAPAROSCOPY    . TUBAL LIGATION      FAMILY HISTORY: The patient family history includes Heart disease in her mother; Heart failure in her father; Hypertension in her brother, father, mother, and sister.  SOCIAL HISTORY:  The patient  reports that she has never smoked. She has never used smokeless tobacco. She reports previous alcohol use. She reports that she does not use drugs.  REVIEW OF SYSTEMS: Review of Systems  Constitutional: Negative for chills and fever.  HENT: Negative for hoarse voice and nosebleeds.   Eyes: Negative for discharge, double vision and pain.  Cardiovascular: Negative for chest pain, claudication, dyspnea on exertion, leg swelling, near-syncope, orthopnea, palpitations, paroxysmal nocturnal dyspnea and syncope.  Respiratory: Negative for hemoptysis and shortness of breath.   Musculoskeletal: Negative for muscle cramps and myalgias.  Gastrointestinal: Negative for abdominal pain, constipation, diarrhea, heartburn, hematemesis, hematochezia, melena, nausea and vomiting.  Neurological: Negative for dizziness and light-headedness.   PHYSICAL EXAM: Vitals with BMI 02/28/2021 02/24/2021 02/23/2021  Height 5\' 0"  - -  Weight 148 lbs - -  BMI 28.9 - -  Systolic 133 137  Diastolic 83 77 117  Pulse 90 65 68     CONSTITUTIONAL: Well-developed and well-nourished. No acute distress.  SKIN: Skin is warm and dry. No rash noted. No cyanosis. No pallor. No jaundice HEAD: Normocephalic and atraumatic.  EYES: No scleral icterus MOUTH/THROAT: Moist oral membranes.  NECK: No JVD present. No thyromegaly noted. No carotid bruits  LYMPHATIC: No visible cervical adenopathy.  CHEST Normal respiratory effort. No intercostal retractions  LUNGS: Clear to auscultation bilaterally. No stridor. No wheezes. No rales.  CARDIOVASCULAR: Regular rate and rhythm, positive S1-S2, no murmurs rubs or gallops appreciated. ABDOMINAL: No apparent ascites.  EXTREMITIES: No peripheral edema.  Right radial site is well-healed, no bruits.  Ecchymosis noted over the left and right triceps area. HEMATOLOGIC: No significant bruising NEUROLOGIC: Oriented to person, place, and time. Nonfocal. Normal muscle tone.  PSYCHIATRIC: Normal mood and affect. Normal behavior. Cooperative  CARDIAC DATABASE: EKG: 02/28/2021: Normal sinus rhythm, 86 bpm, old anteroseptal infarct, nonspecific T wave abnormality.  Echocardiogram: 01/06/2021: LVEF 50-55%, with regional wall motion abnormalities, grade 1 diastolic impairment, trivial MR.  Stress Testing: No  results found for this or any previous visit from the past 1095 days.  Heart Catheterization: 01/07/2021:  LM: Normal LAD: Mid 95% stenosis LCx: Normal RCA: Normal  Successful IVUS guided percutaneous coronary intervention mid LAD PTCA and stent placement 3.5 X 20 mm Synergy drug-eluting stent 0% residual stenosis  LABORATORY DATA: CBC Latest Ref Rng & Units 02/23/2021 01/07/2021 01/06/2021  WBC 4.0 - 10.5 K/uL 7.0 5.6 6.6  Hemoglobin 12.0 - 15.0 g/dL 10.8(L) 12.3 12.6  Hematocrit 36.0 - 46.0 % 34.1(L) 37.4 37.6  Platelets 150 - 400 K/uL 391 240 287    CMP Latest Ref Rng & Units 02/23/2021 01/07/2021 01/05/2021  Glucose 70 - 99 mg/dL 161(W112(H) 88 960(A126(H)  BUN 6 - 20 mg/dL 13 6 15    Creatinine 0.44 - 1.00 mg/dL 5.400.91 9.810.84 1.910.95  Sodium 135 - 145 mmol/L 140 137 135  Potassium 3.5 - 5.1 mmol/L 3.6 3.9 3.3(L)  Chloride 98 - 111 mmol/L 105 107 99  CO2 22 - 32 mmol/L 25 27 29   Calcium 8.9 - 10.3 mg/dL 9.5 4.7(W8.2(L) 8.9  Total Protein 6.5 - 8.1 g/dL 8.0 - 7.1  Total Bilirubin 0.3 - 1.2 mg/dL 0.4 - 2.9(F0.2(L)  Alkaline Phos 38 - 126 U/L 87 - 71  AST 15 - 41 U/L 23 - 22  ALT 0 - 44 U/L 24 - 20    Lipid Panel  Lab Results  Component Value Date   CHOL 196 01/03/2021   HDL 63 01/03/2021   LDLCALC 106 (H) 01/03/2021   LDLDIRECT 105 (H) 01/03/2021   TRIG 156 (H) 01/03/2021   CHOLHDL 3 01/02/2020   No components found for: NTPROBNP No results for input(s): PROBNP in the last 8760 hours. Recent Labs    01/06/21 1620  TSH 1.793    BMP Recent Labs    01/05/21 2230 01/07/21 0615 02/23/21 2130  NA 135 137 140  K 3.3* 3.9 3.6  CL 99 107 105  CO2 29 27 25   GLUCOSE 126* 88 112*  BUN 15 6 13   CREATININE 0.95 0.84 0.91  CALCIUM 8.9 8.2* 9.5  GFRNONAA >60 >60 >60    HEMOGLOBIN A1C Lab Results  Component Value Date   HGBA1C 5.4 01/02/2020    IMPRESSION:    ICD-10-CM   1. Atherosclerosis of native coronary artery of native heart without angina pectoris  I25.10 EKG 12-Lead  2. History of coronary angioplasty with insertion of stent  Z95.5   3. Benign hypertension  I10   4. Mixed hyperlipidemia  E78.2   5. Family history of premature CAD  Z82.49      RECOMMENDATIONS: Theresa PresumeLisa Bass is a 55 y.o. female whose past medical history and cardiac risk factors include: Premature CAD status post PCI to the LAD (April 2022), postmenopausal female, hypertension, on hormone replacement therapy, family history of premature coronary artery disease.  Atherosclerosis of the native coronary arteries without angina pectoris: No active angina pectoris. Since last office visit had an ER visit on 02/23/2021 documentation and lab work reviewed. Status post angioplasty and stenting of  the mid LAD. Medications reconciled. Continue dual antiplatelet therapy.  Hypertension:  Blood pressures are within acceptable range. Medications reconciled. Educated on the importance of a low-salt diet. Patient is asked to call the office if her SBP is greater than 130 mmHg consistently.   Currently managed by primary care provider.  Family history of premature coronary artery disease: Continue dual antiplatelet therapy.  Educated on the importance of improving her modifiable cardiovascular risk factors  such as blood pressure management, stress management, medication compliance.  Given her LHC s/p angioplasty and stenting she has come off of hormone replacement therapy and since then has noted vaginal bleeding.  She has followed up with her GYN and after conservative management with medications recommended D&C for further evaluation and management.  Patient is informed that if the [facility] is lifesaving or limb saving she could proceed and hold dual antiplatelet therapy as recommended as its been more than 30 days.  However, would recommend uninterruption of DAPT therapy for at least 3 months if possible.  Patient states that she is scheduled for D&C mid July.  She is recommended to resume dual antiplatelet therapy after her D&C as recommended by her GYN with regards to the timing.  She verbalizes understanding.  FINAL MEDICATION LIST END OF ENCOUNTER: No orders of the defined types were placed in this encounter.   Medications Discontinued During This Encounter  Medication Reason  . clopidogrel (PLAVIX) 75 MG tablet Error  . estradiol (VIVELLE-DOT) 0.1 MG/24HR patch Error  . progesterone (PROMETRIUM) 100 MG capsule Error     Current Outpatient Medications:  .  ALPRAZolam (XANAX) 0.25 MG tablet, Take 1 tablet (0.25 mg total) by mouth 2 (two) times daily as needed for anxiety., Disp: 10 tablet, Rfl: 0 .  aspirin EC 81 MG tablet, Take 81 mg by mouth daily. Swallow whole., Disp: , Rfl:  .   atorvastatin (LIPITOR) 80 MG tablet, Take 1 tablet (80 mg total) by mouth at bedtime., Disp: 30 tablet, Rfl: 5 .  Cholecalciferol (VITAMIN D-3) 125 MCG (5000 UT) TABS, Take 5,000 Units by mouth at bedtime., Disp: , Rfl:  .  empagliflozin (JARDIANCE) 10 MG TABS tablet, Take 1 tablet (10 mg total) by mouth daily before breakfast., Disp: 90 tablet, Rfl: 3 .  losartan (COZAAR) 50 MG tablet, TAKE 1 TABLET BY MOUTH DAILY., Disp: 90 tablet, Rfl: 1 .  metoprolol succinate (TOPROL-XL) 25 MG 24 hr tablet, TAKE ONE TABLET BY MOUTH DAILY, Disp: 90 tablet, Rfl: 1 .  naratriptan (AMERGE) 2.5 MG tablet, Take one (1) tablet at onset of headache; if returns or does not resolve, may repeat after 4 hours; do not exceed five (5) mg in 24 hours. (Patient taking differently: Take 2.5 mg by mouth 2 (two) times daily as needed for migraine. Take one (1) tablet at onset of headache; if returns or does not resolve, may repeat after 4 hours; do not exceed five (5) mg in 24 hours.), Disp: 9 tablet, Rfl: 1 .  nitroGLYCERIN (NITROSTAT) 0.4 MG SL tablet, Place 1 tablet (0.4 mg total) under the tongue every 5 (five) minutes as needed for chest pain. If you require more than two tablets five minutes apart go to the nearest ER via EMS., Disp: 30 tablet, Rfl: 0 .  pantoprazole (PROTONIX) 40 MG tablet, Take 1 tablet (40 mg total) by mouth daily., Disp: 30 tablet, Rfl: 3 .  ticagrelor (BRILINTA) 90 MG TABS tablet, Take 1 tablet (90 mg total) by mouth 2 (two) times daily., Disp: 60 tablet, Rfl: 3  Orders Placed This Encounter  Procedures  . EKG 12-Lead    There are no Patient Instructions on file for this visit.   --Continue cardiac medications as reconciled in final medication list. --Return in about 6 months (around 08/30/2021) for Follow up, CAD. Or sooner if needed. --Continue follow-up with your primary care physician regarding the management of your other chronic comorbid conditions.  Patient's questions and concerns were  addressed to her satisfaction. She voices understanding of the instructions provided during this encounter.   This note was created using a voice recognition software as a result there may be grammatical errors inadvertently enclosed that do not reflect the nature of this encounter. Every attempt is made to correct such errors.  Rex Kras, [place], [facility]  Pager: [phone] Office: [phone]

## 2021-03-21 ENCOUNTER — Other Ambulatory Visit: Payer: BC Managed Care – PPO

## 2021-04-09 DIAGNOSIS — R9389 Abnormal findings on diagnostic imaging of other specified body structures: Secondary | ICD-10-CM | POA: Diagnosis not present

## 2021-04-09 DIAGNOSIS — N950 Postmenopausal bleeding: Secondary | ICD-10-CM | POA: Diagnosis not present

## 2021-04-11 NOTE — Telephone Encounter (Signed)
From patient.

## 2021-04-12 NOTE — Telephone Encounter (Signed)
Both aspirin and Brilinta are antiplatelet medications which are standard of care and given to patients who recently had stents.  Yes, being on these medications will predispose you to bruising quicker and easier.  Since they are not anticoagulation medications no purpose and checking clotting time.  As discussed before if bruising is truly a huge concern in contrast to your heart we could transition you from Brilinta to Plavix.  However, would not recommend Brilinta once a day dosing.  Regards,  Dr. Odis Hollingshead

## 2021-04-12 NOTE — Telephone Encounter (Signed)
Spoke to patient she is aware

## 2021-04-22 ENCOUNTER — Other Ambulatory Visit: Payer: Self-pay

## 2021-04-22 ENCOUNTER — Ambulatory Visit
Admission: EM | Admit: 2021-04-22 | Discharge: 2021-04-22 | Disposition: A | Discharge status: Home / Self Care | Payer: BC Managed Care – PPO | Attending: Emergency Medicine | Admitting: Emergency Medicine

## 2021-04-22 DIAGNOSIS — L02416 Cutaneous abscess of left lower limb: Secondary | ICD-10-CM | POA: Diagnosis not present

## 2021-04-22 MED ORDER — DOXYCYCLINE HYCLATE 100 MG PO CAPS: 100.0000 mg | ORAL_CAPSULE | Freq: Two times a day (BID) | ORAL | 0 refills | Status: AC

## 2021-04-22 NOTE — Discharge Instructions (Addendum)
Begin doxycycline twice daily x10 days Heat and warm compresses to help bring circulation to the area Elevate leg Tylenol as needed Please return if not seeing any improvement or symptoms worsening over the next 2 to 3 days

## 2021-04-22 NOTE — ED Triage Notes (Addendum)
Pt is today with right leg swelling/irritation.  Pt states that she was kayak  sat and dropped the kayak on her thigh and from then noticed bruising and swelling. Pt also has a spot on the back of her left lower leg where she thinks a mole was snagged

## 2021-04-22 NOTE — ED Provider Notes (Signed)
[facility]    CSN: [id number] Arrival date & time: 04/22/21  0947      History   Chief Complaint Chief Complaint  Patient presents with   leg irritation   Leg Swelling    HPI Theresa Bass is a 55 y.o. female presenting today for evaluation of area of redness to right lower leg.  Reports that she recently went kayaking and later that evening did began to develop area of redness to her posterior calf.  Has worsened since and has developed an area of pus.  Denies fevers.  Denies difficulty bending or moving knee.  Has developed some bruising to thigh as well as near wound as she reports that she is on Brilinta and aspirin from prior stent placement.  Believes she hit her thigh with a kayak over the weekend.  Commonly bruises similarly  HPI  Past Medical History:  Diagnosis Date   Coronary artery disease    Hyperlipidemia    Hypertension    Migraine     Patient Active Problem List   Diagnosis Date Noted   Unstable angina (HCC) 01/05/2021   Migraine     Past Surgical History:  Procedure Laterality Date   CESAREAN SECTION     x 4   CORONARY STENT INTERVENTION N/A 01/07/2021   Procedure: CORONARY STENT INTERVENTION;  Surgeon: Elder Negus, MD;  Location: [facility];  Service: Cardiovascular;  Laterality: N/A;   ELBOW SURGERY     HYSTEROSCOPY     INTRAVASCULAR ULTRASOUND/IVUS N/A 01/07/2021   Procedure: Intravascular Ultrasound/IVUS;  Surgeon: Elder Negus, MD;  Location: [facility];  Service: Cardiovascular;  Laterality: N/A;   LEFT HEART CATH AND CORONARY ANGIOGRAPHY N/A 01/07/2021   Procedure: LEFT HEART CATH AND CORONARY ANGIOGRAPHY;  Surgeon: Elder Negus, MD;  Location: [facility];  Service: Cardiovascular;  Laterality: N/A;   PELVIC LAPAROSCOPY     TUBAL LIGATION      OB History     Gravida  5   Para  4   Term      Preterm      AB  1   Living  4      SAB      IAB      Ectopic       Multiple      Live Births               Home Medications    Prior to Admission medications   Medication Sig Start Date End Date Taking? Authorizing Provider  doxycycline (VIBRAMYCIN) 100 MG capsule Take 1 capsule (100 mg total) by mouth 2 (two) times daily for 10 days. 04/22/21 05/02/21 Yes Joyceann Kruser C, PA-C  ALPRAZolam (XANAX) 0.25 MG tablet Take 1 tablet (0.25 mg total) by mouth 2 (two) times daily as needed for anxiety. 01/09/21   Copland, Gwenlyn Found, MD  aspirin EC 81 MG tablet Take 81 mg by mouth daily. Swallow whole.    [provider]  atorvastatin (LIPITOR) 80 MG tablet Take 1 tablet (80 mg total) by mouth at bedtime. 01/30/21   Tolia, Sunit, DO  Cholecalciferol (VITAMIN D-3) 125 MCG (5000 UT) TABS Take 5,000 Units by mouth at bedtime.    [provider]  empagliflozin (JARDIANCE) 10 MG TABS tablet Take 1 tablet (10 mg total) by mouth daily before breakfast. 02/12/21 05/13/21  Tolia, Sunit, DO  losartan (COZAAR) 50 MG tablet TAKE 1 TABLET BY MOUTH DAILY. 01/30/21  Copland, Gwenlyn Found, MD  metoprolol succinate (TOPROL-XL) 25 MG 24 hr tablet TAKE ONE TABLET BY MOUTH DAILY 01/30/21   Tolia, Sunit, DO  naratriptan (AMERGE) 2.5 MG tablet Take one (1) tablet at onset of headache; if returns or does not resolve, may repeat after 4 hours; do not exceed five (5) mg in 24 hours. Patient taking differently: Take 2.5 mg by mouth 2 (two) times daily as needed for migraine. Take one (1) tablet at onset of headache; if returns or does not resolve, may repeat after 4 hours; do not exceed five (5) mg in 24 hours. 11/11/18   Fontaine, Nadyne Coombes, MD  nitroGLYCERIN (NITROSTAT) 0.4 MG SL tablet Place 1 tablet (0.4 mg total) under the tongue every 5 (five) minutes as needed for chest pain. If you require more than two tablets five minutes apart go to the nearest ER via EMS. 01/02/21 02/01/21  Tolia, Sunit, DO  pantoprazole (PROTONIX) 40 MG tablet Take 1 tablet (40 mg total) by mouth daily. 01/09/21    Copland, Gwenlyn Found, MD  ticagrelor (BRILINTA) 90 MG TABS tablet Take 1 tablet (90 mg total) by mouth 2 (two) times daily. 02/12/21   Tessa Lerner, DO    Family History Family History  Problem Relation Age of Onset   Hypertension Mother    Heart disease Mother    Hypertension Father    Heart failure Father    Hypertension Brother    Hypertension Sister     Social History Social History   Tobacco Use   Smoking status: Never   Smokeless tobacco: Never  Vaping Use   Vaping Use: Never used  Substance Use Topics   Alcohol use: Not Currently    Alcohol/week: 0.0 standard drinks    Comment: Rare   Drug use: No     Allergies   Patient has no known allergies.   Review of Systems Review of Systems  Constitutional:  Negative for fatigue and fever.  HENT:  Negative for mouth sores.   Eyes:  Negative for visual disturbance.  Respiratory:  Negative for shortness of breath.   Cardiovascular:  Negative for chest pain.  Gastrointestinal:  Negative for abdominal pain, nausea and vomiting.  Genitourinary:  Negative for genital sores.  Musculoskeletal:  Negative for arthralgias and joint swelling.  Skin:  Positive for color change. Negative for rash and wound.  Neurological:  Negative for dizziness, weakness, light-headedness and headaches.    Physical Exam Triage Vital Signs ED Triage Vitals  Enc Vitals Group     BP 04/22/21 1009 (!) 146/83     Pulse Rate 04/22/21 1009 64     Resp 04/22/21 1009 18     Temp 04/22/21 1009 98.2 F (36.8 C)     Temp Source 04/22/21 1009 Oral     SpO2 04/22/21 1009 98 %     Weight --      Height --      Head Circumference --      Peak Flow --      Pain Score 04/22/21 1006 8     Pain Loc --      Pain Edu? --      Excl. in GC? --    No data found.  Updated Vital Signs BP (!) 146/83   Pulse 64   Temp 98.2 F (36.8 C) (Oral)   Resp 18   LMP 12/22/2020 (Approximate) Comment: states she takes HRT  SpO2 98%   Visual Acuity Right Eye  Distance:  Left Eye Distance:   Bilateral Distance:    Right Eye Near:   Left Eye Near:    Bilateral Near:     Physical Exam Vitals and nursing note reviewed.  Constitutional:      Appearance: She is well-developed.     Comments: No acute distress  HENT:     Head: Normocephalic and atraumatic.     Nose: Nose normal.  Eyes:     Conjunctiva/sclera: Conjunctivae normal.  Cardiovascular:     Rate and Rhythm: Normal rate.  Pulmonary:     Effort: Pulmonary effort is normal. No respiratory distress.  Abdominal:     General: There is no distension.  Musculoskeletal:        General: Normal range of motion.     Cervical back: Neck supple.  Skin:    General: Skin is warm and dry.     Comments: Ecchymosis noted to anterior distal thigh, posterior right calf with area of erythema with central superficial pus and mild surrounding induration, slight ecchymosis noted just lateral to this  Neurological:     Mental Status: She is alert and oriented to person, place, and time.     UC Treatments / Results  Labs (all labs ordered are listed, but only abnormal results are displayed) Labs Reviewed - No data to display  EKG   Radiology No results found.  Procedures Procedures (including critical care time)  Medications Ordered in UC Medications - No data to display  Initial Impression / Assessment and Plan / UC Course  I have reviewed the triage vital signs and the nursing notes.  Pertinent labs & imaging results that were available during my care of the patient were reviewed by me and considered in my medical decision making (see chart for details).     Cellulitis versus superficial abscess, treating with doxycycline warm compresses and close monitoring, deferring I&D given small amount of pus noted today, do not suspect deeper pus pocket to drain.  Continue to monitor,Discussed strict return precautions. Patient verbalized understanding and is agreeable with plan.  Final  Clinical Impressions(s) / UC Diagnoses   Final diagnoses:  Cutaneous abscess of left lower extremity     Discharge Instructions      Begin doxycycline twice daily x10 days Heat and warm compresses to help bring circulation to the area Elevate leg Tylenol as needed Please return if not seeing any improvement or symptoms worsening over the next 2 to 3 days     ED Prescriptions     Medication Sig Dispense Auth. Provider   doxycycline (VIBRAMYCIN) 100 MG capsule Take 1 capsule (100 mg total) by mouth 2 (two) times daily for 10 days. 20 capsule Sadhana Frater, [place] C, PA-C      PDMP not reviewed this encounter.   Lew Dawes, [place] 04/22/21 1202

## 2021-04-23 NOTE — Progress Notes (Deleted)
[facility] [address], [address] [place], [place] [address] [phone]  Date:  04/24/2021   Name:  Theresa Bass   DOB:  02/17/1966   MRN:  [id number]  PCP:  Pearline Cables, MD    Chief Complaint: No chief complaint on file.   History of Present Illness:  Theresa Bass is a 55 y.o. very pleasant female patient who presents with the following:  Patient seen today with concern of something on her leg History of migraine headache, CAD status post coronary stent this last April Last visit with myself about 1 year ago  Labs done in May Pap smear COVID-19 booster Mammogram up-to-date Cologuard up-to-date Patient Active Problem List   Diagnosis Date Noted   Unstable angina (HCC) 01/05/2021   Migraine     Past Medical History:  Diagnosis Date   Coronary artery disease    Hyperlipidemia    Hypertension    Migraine     Past Surgical History:  Procedure Laterality Date   CESAREAN SECTION     x 4   CORONARY STENT INTERVENTION N/A 01/07/2021   Procedure: CORONARY STENT INTERVENTION;  Surgeon: Elder Negus, MD;  Location: [facility];  Service: Cardiovascular;  Laterality: N/A;   ELBOW SURGERY     HYSTEROSCOPY     INTRAVASCULAR ULTRASOUND/IVUS N/A 01/07/2021   Procedure: Intravascular Ultrasound/IVUS;  Surgeon: Elder Negus, MD;  Location: [facility];  Service: Cardiovascular;  Laterality: N/A;   LEFT HEART CATH AND CORONARY ANGIOGRAPHY N/A 01/07/2021   Procedure: LEFT HEART CATH AND CORONARY ANGIOGRAPHY;  Surgeon: Elder Negus, MD;  Location: [facility];  Service: Cardiovascular;  Laterality: N/A;   PELVIC LAPAROSCOPY     TUBAL LIGATION      Social History   Tobacco Use   Smoking status: Never   Smokeless tobacco: Never  Vaping Use   Vaping Use: Never used  Substance Use Topics   Alcohol use: Not Currently    Alcohol/week: 0.0 standard drinks    Comment: Rare    Drug use: No    Family History  Problem Relation Age of Onset   Hypertension Mother    Heart disease Mother    Hypertension Father    Heart failure Father    Hypertension Brother    Hypertension Sister     No Known Allergies  Medication list has been reviewed and updated.  Current Outpatient Medications on File Prior to Visit  Medication Sig Dispense Refill   ALPRAZolam (XANAX) 0.25 MG tablet Take 1 tablet (0.25 mg total) by mouth 2 (two) times daily as needed for anxiety. 10 tablet 0   aspirin EC 81 MG tablet Take 81 mg by mouth daily. Swallow whole.     atorvastatin (LIPITOR) 80 MG tablet Take 1 tablet (80 mg total) by mouth at bedtime. 30 tablet 5   Cholecalciferol (VITAMIN D-3) 125 MCG (5000 UT) TABS Take 5,000 Units by mouth at bedtime.     doxycycline (VIBRAMYCIN) 100 MG capsule Take 1 capsule (100 mg total) by mouth 2 (two) times daily for 10 days. 20 capsule 0   empagliflozin (JARDIANCE) 10 MG TABS tablet Take 1 tablet (10 mg total) by mouth daily before breakfast. 90 tablet 3   losartan (COZAAR) 50 MG tablet TAKE 1 TABLET BY MOUTH DAILY. 90 tablet 1   metoprolol succinate (TOPROL-XL) 25 MG 24 hr tablet TAKE ONE TABLET BY MOUTH DAILY 90  tablet 1   naratriptan (AMERGE) 2.5 MG tablet Take one (1) tablet at onset of headache; if returns or does not resolve, may repeat after 4 hours; do not exceed five (5) mg in 24 hours. (Patient taking differently: Take 2.5 mg by mouth 2 (two) times daily as needed for migraine. Take one (1) tablet at onset of headache; if returns or does not resolve, may repeat after 4 hours; do not exceed five (5) mg in 24 hours.) 9 tablet 1   nitroGLYCERIN (NITROSTAT) 0.4 MG SL tablet Place 1 tablet (0.4 mg total) under the tongue every 5 (five) minutes as needed for chest pain. If you require more than two tablets five minutes apart go to the nearest ER via EMS. 30 tablet 0   pantoprazole (PROTONIX) 40 MG tablet Take 1 tablet (40 mg total) by mouth daily. 30  tablet 3   ticagrelor (BRILINTA) 90 MG TABS tablet Take 1 tablet (90 mg total) by mouth 2 (two) times daily. 60 tablet 3   No current facility-administered medications on file prior to visit.    Review of Systems:  As per HPI- otherwise negative.   Physical Examination: There were no vitals filed for this visit. There were no vitals filed for this visit. There is no height or weight on file to calculate BMI. Ideal Body Weight:    GEN: no acute distress. HEENT: Atraumatic, Normocephalic.  Ears and Nose: No external deformity. CV: RRR, No M/G/R. No JVD. No thrill. No extra heart sounds. PULM: CTA B, no wheezes, crackles, rhonchi. No retractions. No resp. distress. No accessory muscle use. ABD: S, NT, ND, +BS. No rebound. No HSM. EXTR: No c/c/e PSYCH: Normally interactive. Conversant.    Assessment and Plan: *** This visit occurred during the SARS-CoV-2 public health emergency.  Safety protocols were in place, including screening questions prior to the visit, additional usage of staff PPE, and extensive cleaning of exam room while observing appropriate contact time as indicated for disinfecting solutions.   Signed Abbe Amsterdam, MD

## 2021-04-24 ENCOUNTER — Ambulatory Visit: Payer: BC Managed Care – PPO | Admitting: Family Medicine

## 2021-05-21 DIAGNOSIS — N951 Menopausal and female climacteric states: Secondary | ICD-10-CM | POA: Diagnosis not present

## 2021-05-21 DIAGNOSIS — Z1151 Encounter for screening for human papillomavirus (HPV): Secondary | ICD-10-CM | POA: Diagnosis not present

## 2021-05-21 DIAGNOSIS — Z01411 Encounter for gynecological examination (general) (routine) with abnormal findings: Secondary | ICD-10-CM | POA: Diagnosis not present

## 2021-05-21 DIAGNOSIS — N952 Postmenopausal atrophic vaginitis: Secondary | ICD-10-CM | POA: Diagnosis not present

## 2021-05-21 DIAGNOSIS — N941 Unspecified dyspareunia: Secondary | ICD-10-CM | POA: Diagnosis not present

## 2021-05-21 DIAGNOSIS — Z124 Encounter for screening for malignant neoplasm of cervix: Secondary | ICD-10-CM | POA: Diagnosis not present

## 2021-05-24 LAB — HM PAP SMEAR: HM Pap smear: NORMAL

## 2021-05-24 LAB — RESULTS CONSOLE HPV: CHL HPV: NEGATIVE

## 2021-06-10 ENCOUNTER — Other Ambulatory Visit: Payer: Self-pay | Admitting: Cardiology

## 2021-07-23 ENCOUNTER — Ambulatory Visit: Payer: BC Managed Care – PPO | Admitting: Cardiology

## 2021-08-01 ENCOUNTER — Other Ambulatory Visit: Payer: Self-pay | Admitting: Family Medicine

## 2021-08-01 DIAGNOSIS — I10 Essential (primary) hypertension: Secondary | ICD-10-CM

## 2021-08-10 ENCOUNTER — Other Ambulatory Visit: Payer: Self-pay | Admitting: Cardiology

## 2021-08-10 DIAGNOSIS — I209 Angina pectoris, unspecified: Secondary | ICD-10-CM

## 2021-08-15 DIAGNOSIS — N941 Unspecified dyspareunia: Secondary | ICD-10-CM | POA: Diagnosis not present

## 2021-08-15 DIAGNOSIS — N951 Menopausal and female climacteric states: Secondary | ICD-10-CM | POA: Diagnosis not present

## 2021-08-15 DIAGNOSIS — N952 Postmenopausal atrophic vaginitis: Secondary | ICD-10-CM | POA: Diagnosis not present

## 2021-08-29 ENCOUNTER — Other Ambulatory Visit: Payer: Self-pay

## 2021-08-29 ENCOUNTER — Ambulatory Visit
Admission: RE | Admit: 2021-08-29 | Discharge: 2021-08-29 | Disposition: A | Discharge status: Home / Self Care | Payer: BC Managed Care – PPO | Source: Ambulatory Visit | Attending: Family Medicine | Admitting: Family Medicine

## 2021-08-29 DIAGNOSIS — N6489 Other specified disorders of breast: Secondary | ICD-10-CM

## 2021-08-29 DIAGNOSIS — R922 Inconclusive mammogram: Secondary | ICD-10-CM | POA: Diagnosis not present

## 2021-08-30 ENCOUNTER — Ambulatory Visit: Payer: BC Managed Care – PPO | Admitting: Cardiology

## 2021-09-07 ENCOUNTER — Other Ambulatory Visit: Payer: Self-pay | Admitting: Cardiology

## 2021-09-07 DIAGNOSIS — I209 Angina pectoris, unspecified: Secondary | ICD-10-CM

## 2021-09-13 ENCOUNTER — Other Ambulatory Visit: Payer: Self-pay

## 2021-09-13 ENCOUNTER — Ambulatory Visit: Payer: BC Managed Care – PPO | Admitting: Cardiology

## 2021-09-13 ENCOUNTER — Encounter: Payer: Self-pay | Admitting: Cardiology

## 2021-09-13 VITALS — BP 135/84 | HR 62 | Resp 16 | Ht 60.0 in | Wt 141.0 lb

## 2021-09-13 DIAGNOSIS — E782 Mixed hyperlipidemia: Secondary | ICD-10-CM | POA: Diagnosis not present

## 2021-09-13 DIAGNOSIS — I2510 Atherosclerotic heart disease of native coronary artery without angina pectoris: Secondary | ICD-10-CM

## 2021-09-13 DIAGNOSIS — Z955 Presence of coronary angioplasty implant and graft: Secondary | ICD-10-CM | POA: Diagnosis not present

## 2021-09-13 DIAGNOSIS — I10 Essential (primary) hypertension: Secondary | ICD-10-CM

## 2021-09-13 DIAGNOSIS — Z8249 Family history of ischemic heart disease and other diseases of the circulatory system: Secondary | ICD-10-CM

## 2021-09-13 MED ORDER — BRILINTA 90 MG PO TABS: 90.0000 mg | ORAL_TABLET | Freq: Two times a day (BID) | ORAL | 0 refills | Status: DC

## 2021-09-13 NOTE — Progress Notes (Signed)
Date:  09/13/2021   ID:  Ciro Backer, DOB April 02, 1966, MRN [id number]  PCP:  Darreld Mclean, MD  Cardiologist:  Rex Kras, DO, [facility] (established care 01/02/2021)  Date: 09/13/21 Last Office Visit: 02/28/2021   Chief Complaint  Patient presents with   Coronary Artery Disease   Follow-up    HPI  Theresa Bass is a 55 y.o. female who presents to the office with a chief complaint of " 56-month follow-up for CAD management." Patient's past medical history and cardiovascular risk factors include: Premature CAD status post PCI to the LAD (April 2022), postmenopausal female, hypertension, on hormone replacement therapy, family history of premature coronary artery disease.  She is referred to the office at the request of Copland, Gay Filler, MD for evaluation of abnormal ECG.  Patient establish care in April 2022 with symptoms concerning for unstable angina.  However, at that time she did not want to be admitted to the hospital for further evaluation and was scheduled for an elective left heart catheterization.  However prior to the elective heart catheterization she presented to the ED for worsening chest discomfort.  She underwent left heart catheterization was noted to have disease in the LAD distribution and is now status post angioplasty and stent within the LAD.  Since last office visit patient states that she is doing well from a cardiovascular standpoint.  She denies any chest pain or shortness of breath at rest or with effort related activities.  She is walking at least 6 to 7 miles per day accounting for greater than 10,000 steps per day.  She is currently on dual antiplatelet therapy given her episode of unstable angina in April 2022.  She complains of easy bruising but no significant blood loss.  Patient still has not had D&C performed which was discussed about at the prior office visits.  Patient states that she is not to be enthusiastic about proceeding forward but will  discuss it further with GYN.  Patient is made aware that she can proceed with D&C is medically necessary.  Unfortunately, patient's father passed away in May 04, 2021.  Family history of premature coronary artery disease: Mom at the age of 39 had 2 PCI's performed due to an abnormal stress test.  FUNCTIONAL STATUS: She walks at least 25 minutes twice a day and lives a very active lifestyle.  ALLERGIES: No Known Allergies  MEDICATION LIST PRIOR TO VISIT: Current Meds  Medication Sig   ALPRAZolam (XANAX) 0.25 MG tablet Take 1 tablet (0.25 mg total) by mouth 2 (two) times daily as needed for anxiety.   aspirin EC 81 MG tablet Take 81 mg by mouth daily. Swallow whole.   atorvastatin (LIPITOR) 80 MG tablet TAKE 1 TABLET BY MOUTH EVERY DAY   Cholecalciferol (VITAMIN D-3) 125 MCG (5000 UT) TABS Take 5,000 Units by mouth at bedtime.   JARDIANCE 10 MG TABS tablet Take 10 mg by mouth every morning.   losartan (COZAAR) 50 MG tablet TAKE 1 TABLET BY MOUTH DAILY.   metoprolol succinate (TOPROL-XL) 25 MG 24 hr tablet TAKE 1 TABLET BY MOUTH EVERY DAY   naratriptan (AMERGE) 2.5 MG tablet Take one (1) tablet at onset of headache; if returns or does not resolve, may repeat after 4 hours; do not exceed five (5) mg in 24 hours. (Patient taking differently: Take 2.5 mg by mouth 2 (two) times daily as needed for migraine. Take one (1) tablet at onset of headache; if returns or does not resolve, may repeat  after 4 hours; do not exceed five (5) mg in 24 hours.)   nitroGLYCERIN (NITROSTAT) 0.4 MG SL tablet Place 1 tablet (0.4 mg total) under the tongue every 5 (five) minutes as needed for chest pain. If you require more than two tablets five minutes apart go to the nearest ER via EMS.   pantoprazole (PROTONIX) 40 MG tablet Take 1 tablet (40 mg total) by mouth daily.   [DISCONTINUED] BRILINTA 90 MG TABS tablet TAKE 1 TABLET BY MOUTH TWICE DAILY     PAST MEDICAL HISTORY: Past Medical History:  Diagnosis Date    Coronary artery disease    Hyperlipidemia    Hypertension    Migraine     PAST SURGICAL HISTORY: Past Surgical History:  Procedure Laterality Date   CESAREAN SECTION     x 4   CORONARY STENT INTERVENTION N/A 01/07/2021   Procedure: CORONARY STENT INTERVENTION;  Surgeon: Nigel Mormon, MD;  Location: [facility];  Service: Cardiovascular;  Laterality: N/A;   ELBOW SURGERY     HYSTEROSCOPY     INTRAVASCULAR ULTRASOUND/IVUS N/A 01/07/2021   Procedure: Intravascular Ultrasound/IVUS;  Surgeon: Nigel Mormon, MD;  Location: [facility];  Service: Cardiovascular;  Laterality: N/A;   LEFT HEART CATH AND CORONARY ANGIOGRAPHY N/A 01/07/2021   Procedure: LEFT HEART CATH AND CORONARY ANGIOGRAPHY;  Surgeon: Nigel Mormon, MD;  Location: [facility];  Service: Cardiovascular;  Laterality: N/A;   PELVIC LAPAROSCOPY     TUBAL LIGATION      FAMILY HISTORY: The patient family history includes Heart disease in her mother; Heart failure in her father; Hypertension in her brother, father, mother, and sister.  SOCIAL HISTORY:  The patient  reports that she has never smoked. She has never used smokeless tobacco. She reports that she does not currently use alcohol. She reports that she does not use drugs.  REVIEW OF SYSTEMS: Review of Systems  Constitutional: Negative for chills and fever.  HENT:  Negative for hoarse voice and nosebleeds.   Eyes:  Negative for discharge, double vision and pain.  Cardiovascular:  Negative for chest pain, claudication, dyspnea on exertion, leg swelling, near-syncope, orthopnea, palpitations, paroxysmal nocturnal dyspnea and syncope.  Respiratory:  Negative for hemoptysis and shortness of breath.   Musculoskeletal:  Negative for muscle cramps and myalgias.  Gastrointestinal:  Negative for abdominal pain, constipation, diarrhea, heartburn, hematemesis, hematochezia, melena, nausea and vomiting.  Neurological:  Negative for dizziness  and light-headedness.   PHYSICAL EXAM: Vitals with BMI 09/13/2021 04/22/2021 02/28/2021  Height $Remov'5\' 0"'TeSefo$  - $'5\' 0"'Y$   Weight 141 lbs - 148 lbs  BMI 16.38 - 46.6  Systolic 599 357 017  Diastolic 84 83 83  Pulse 62 64 90    CONSTITUTIONAL: Well-developed and well-nourished. No acute distress.  SKIN: Skin is warm and dry. No rash noted. No cyanosis. No pallor. No jaundice HEAD: Normocephalic and atraumatic.  EYES: No scleral icterus MOUTH/THROAT: Moist oral membranes.  NECK: No JVD present. No thyromegaly noted. No carotid bruits  LYMPHATIC: No visible cervical adenopathy.  CHEST Normal respiratory effort. No intercostal retractions  LUNGS: Clear to auscultation bilaterally. No stridor. No wheezes. No rales.  CARDIOVASCULAR: Regular rate and rhythm, positive S1-S2, no murmurs rubs or gallops appreciated. ABDOMINAL: No apparent ascites.  EXTREMITIES: No peripheral edema.  Right radial site is well-healed, no bruits.  Ecchymosis noted over the left and right triceps area. HEMATOLOGIC: No significant bruising NEUROLOGIC: Oriented to person, place, and time. Nonfocal. Normal muscle  tone.  PSYCHIATRIC: Normal mood and affect. Normal behavior. Cooperative  CARDIAC DATABASE: EKG: 09/13/2021: Sinus bradycardia, 59 bpm, old anteroseptal infarct, without underlying injury pattern.  Echocardiogram: 01/06/2021: LVEF 50-55%, with regional wall motion abnormalities, grade 1 diastolic impairment, trivial MR.  Stress Testing: No results found for this or any previous visit from the past 1095 days.  Heart Catheterization: 01/07/2021:  LM: Normal LAD: Mid 95% stenosis LCx: Normal RCA: Normal   Successful IVUS guided percutaneous coronary intervention mid LAD PTCA and stent placement 3.5 X 20 mm Synergy drug-eluting stent 0% residual stenosis  LABORATORY DATA: CBC Latest Ref Rng & Units 02/23/2021 01/07/2021 01/06/2021  WBC 4.0 - 10.5 K/uL 7.0 5.6 6.6  Hemoglobin 12.0 - 15.0 g/dL 10.8(L) 12.3 12.6   Hematocrit 36.0 - 46.0 % 34.1(L) 37.4 37.6  Platelets 150 - 400 K/uL 391 240 287    CMP Latest Ref Rng & Units 02/23/2021 01/07/2021 01/05/2021  Glucose 70 - 99 mg/dL 112(H) 88 126(H)  BUN 6 - 20 mg/dL $Remove'13 6 15  'xxrnoeT$ Creatinine 0.44 - 1.00 mg/dL 0.91 0.84 0.95  Sodium 135 - 145 mmol/L 140 137 135  Potassium 3.5 - 5.1 mmol/L 3.6 3.9 3.3(L)  Chloride 98 - 111 mmol/L 105 107 99  CO2 22 - 32 mmol/L $RemoveB'25 27 29  'pDDIsFBG$ Calcium 8.9 - 10.3 mg/dL 9.5 8.2(L) 8.9  Total Protein 6.5 - 8.1 g/dL 8.0 - 7.1  Total Bilirubin 0.3 - 1.2 mg/dL 0.4 - 0.2(L)  Alkaline Phos 38 - 126 U/L 87 - 71  AST 15 - 41 U/L 23 - 22  ALT 0 - 44 U/L 24 - 20    Lipid Panel  Lab Results  Component Value Date   CHOL 196 01/03/2021   HDL 63 01/03/2021   LDLCALC 106 (H) 01/03/2021   LDLDIRECT 105 (H) 01/03/2021   TRIG 156 (H) 01/03/2021   CHOLHDL 3 01/02/2020   No components found for: NTPROBNP No results for input(s): PROBNP in the last 8760 hours. Recent Labs    01/06/21 1620  TSH 1.793    BMP Recent Labs    01/05/21 2230 01/07/21 0615 02/23/21 2130  NA 135 137 140  K 3.3* 3.9 3.6  CL 99 107 105  CO2 $Re'29 27 25  'KQp$ GLUCOSE 126* 88 112*  BUN $Re'15 6 13  'hwB$ CREATININE 0.95 0.84 0.91  CALCIUM 8.9 8.2* 9.5  GFRNONAA >60 >60 >60    HEMOGLOBIN A1C Lab Results  Component Value Date   HGBA1C 5.4 01/02/2020    IMPRESSION:    ICD-10-CM   1. Atherosclerosis of native coronary artery of native heart without angina pectoris  I25.10 EKG 12-Lead    BRILINTA 90 MG TABS tablet    Lipid Panel With LDL/HDL Ratio    LDL cholesterol, direct    CMP14+EGFR    Hemoglobin A1c    2. History of coronary angioplasty with insertion of stent  Z95.5 BRILINTA 90 MG TABS tablet    3. Benign hypertension  I10     4. Mixed hyperlipidemia  E78.2 Lipid Panel With LDL/HDL Ratio    LDL cholesterol, direct    CMP14+EGFR    5. Family history of premature CAD  Z82.49        RECOMMENDATIONS: Theresa Bass is a 55 y.o. female whose past  medical history and cardiac risk factors include: Premature CAD status post PCI to the LAD (April 2022), postmenopausal female, hypertension, on hormone replacement therapy, family history of premature coronary artery disease.  Atherosclerosis of the  native coronary arteries, status post PCI, without angina pectoris: No active angina pectoris. No use of sublingual nitroglycerin tablets. Recommend dual antiplatelet therapy for 1 year until January 07, 2022. Brilinta refilled and samples also provided. No recent labs available for review.  Patient states that she does not have follow-up visit with PCP up until April 2023.  Patient is requesting labs to be performed. Will check a CMP, fasting lipid profile, direct LDL, and A1c (A1c was requested by the patient). If the LDL levels have improved.  We will reduce the dose of atorvastatin. Continue aspirin indefinitely as long as she remains low risk for bleeding  Hypertension:  Blood pressures are within acceptable range. Medications reconciled. Educated on the importance of a low-salt diet. Currently managed by primary care provider.  Family history of premature coronary artery disease: Educated on the importance of improving her modifiable cardiovascular risk factors such as blood pressure management, stress management, medication compliance.  Since she has completed uninterrupted dual antiplatelet therapy for greater than 3 months.  She may interrupt dual antiplatelet therapy if D&C is clinically warranted prior to April 2022.  If she has any questions concerning regarding this matter she is asked to call the office for additional questions or concerns.  FINAL MEDICATION LIST END OF ENCOUNTER: Meds ordered this encounter  Medications   BRILINTA 90 MG TABS tablet    Sig: Take 1 tablet (90 mg total) by mouth 2 (two) times daily.    Dispense:  180 tablet    Refill:  0    Medications Discontinued During This Encounter  Medication Reason    BRILINTA 90 MG TABS tablet Reorder     Current Outpatient Medications:    ALPRAZolam (XANAX) 0.25 MG tablet, Take 1 tablet (0.25 mg total) by mouth 2 (two) times daily as needed for anxiety., Disp: 10 tablet, Rfl: 0   aspirin EC 81 MG tablet, Take 81 mg by mouth daily. Swallow whole., Disp: , Rfl:    atorvastatin (LIPITOR) 80 MG tablet, TAKE 1 TABLET BY MOUTH EVERY DAY, Disp: 30 tablet, Rfl: 5   Cholecalciferol (VITAMIN D-3) 125 MCG (5000 UT) TABS, Take 5,000 Units by mouth at bedtime., Disp: , Rfl:    JARDIANCE 10 MG TABS tablet, Take 10 mg by mouth every morning., Disp: , Rfl:    losartan (COZAAR) 50 MG tablet, TAKE 1 TABLET BY MOUTH DAILY., Disp: 90 tablet, Rfl: 1   metoprolol succinate (TOPROL-XL) 25 MG 24 hr tablet, TAKE 1 TABLET BY MOUTH EVERY DAY, Disp: 90 tablet, Rfl: 1   naratriptan (AMERGE) 2.5 MG tablet, Take one (1) tablet at onset of headache; if returns or does not resolve, may repeat after 4 hours; do not exceed five (5) mg in 24 hours. (Patient taking differently: Take 2.5 mg by mouth 2 (two) times daily as needed for migraine. Take one (1) tablet at onset of headache; if returns or does not resolve, may repeat after 4 hours; do not exceed five (5) mg in 24 hours.), Disp: 9 tablet, Rfl: 1   nitroGLYCERIN (NITROSTAT) 0.4 MG SL tablet, Place 1 tablet (0.4 mg total) under the tongue every 5 (five) minutes as needed for chest pain. If you require more than two tablets five minutes apart go to the nearest ER via EMS., Disp: 30 tablet, Rfl: 0   pantoprazole (PROTONIX) 40 MG tablet, Take 1 tablet (40 mg total) by mouth daily., Disp: 30 tablet, Rfl: 3   BRILINTA 90 MG TABS tablet, Take 1 tablet (90  mg total) by mouth 2 (two) times daily., Disp: 180 tablet, Rfl: 0  Orders Placed This Encounter  Procedures   Lipid Panel With LDL/HDL Ratio   LDL cholesterol, direct   CMP14+EGFR   Hemoglobin A1c   EKG 12-Lead    There are no Patient Instructions on file for this visit.   --Continue  cardiac medications as reconciled in final medication list. --Return in about 1 year (around 09/13/2022) for Follow up, CAD. Or sooner if needed. --Continue follow-up with your primary care physician regarding the management of your other chronic comorbid conditions.  Patient's questions and concerns were addressed to her satisfaction. She voices understanding of the instructions provided during this encounter.   This note was created using a voice recognition software as a result there may be grammatical errors inadvertently enclosed that do not reflect the nature of this encounter. Every attempt is made to correct such errors.  Rex Kras, [place], [facility]  Pager: [phone] Office: [phone]

## 2021-11-04 ENCOUNTER — Encounter: Payer: Self-pay | Admitting: Cardiology

## 2021-11-17 ENCOUNTER — Other Ambulatory Visit: Payer: Self-pay | Admitting: Cardiology

## 2021-11-17 DIAGNOSIS — I2510 Atherosclerotic heart disease of native coronary artery without angina pectoris: Secondary | ICD-10-CM

## 2021-11-17 DIAGNOSIS — Z955 Presence of coronary angioplasty implant and graft: Secondary | ICD-10-CM

## 2021-11-22 ENCOUNTER — Other Ambulatory Visit (HOSPITAL_BASED_OUTPATIENT_CLINIC_OR_DEPARTMENT_OTHER): Payer: Self-pay

## 2021-11-30 ENCOUNTER — Other Ambulatory Visit: Payer: Self-pay | Admitting: Cardiology

## 2021-12-03 ENCOUNTER — Encounter: Payer: Self-pay | Admitting: Cardiology

## 2021-12-03 NOTE — Telephone Encounter (Signed)
From patient.

## 2021-12-21 NOTE — Patient Instructions (Addendum)
It was great to see you again today, I am sorry for the loss of your dad!  ?I will be in touch with your labs soon as possible ? ?Perhaps check the website or call your insurance- see if they prefer a different SGLT2- drug instead of Jardiance  ?

## 2021-12-21 NOTE — Progress Notes (Addendum)
[facility] ?[address], [address] ?[place], [place] [address] ?[phone] ?Fax [phone] ? ?Date:  12/25/2021  ? ?Name:  Theresa GeneLisa G Ames   DOB:  10-10-1965   MRN:  [id number] ? ?PCP:  Pearline Cablesopland, Katalin Colledge C, MD  ? ? ?Chief Complaint: Annual Exam (Concerns/ questions: /Pap: Dr Corena PilgrimPolidoro) ? ? ?History of Present Illness: ? ?Theresa Bass is a 56 y.o. very pleasant female patient who presents with the following: ? ?Patient seen today for physical exam ?Most recent visit with myself about 1 year ago ?History of hypertension, migraine headache, CAD-she was admitted 1 year ago and had a PCI ? ?She has GYN care ?Mammogram up-to-date ?Cologuard up-to-date ?COVID booster - done  ?Labs done 1 year ago, can update ? ?She was seen by her cardiology team in December-[facility]-stable, no change ? ?At our visit last year she was under a fair amount of stress with her dad's declining health.  He did pass away last summer- she is managing this ok ?Misty StanleyLisa notes she is doing great overall -her son had a baby and he is doing very well.  His baby daughter seems to have brought more stability to his life- Denny Peonvery is now 736 months old!  ? ?She recently ran a 5k, she is walking 6-7 miles a day ?She came off hormone replacement after her PCI ?She has been taking Jardiance following her cardiac event.  However, the last time she went to refill it was $800 for 90-day supply.  Her cardiologist approved her stopping this medicine-since she stopped she has gained about 5 pounds which is frustrating to her. ? ?Aspirin 81 ?Brilinta 90- can stop in April per cardiology ?Lipitor- she is on 80 mg right now ?Jardiance-   ?Losartan ?Metoprolol ?Nitro as needed- has not needed ?Amerge as needed headache ?Patient Active Problem List  ? Diagnosis Date Noted  ? Unstable angina (HCC) 01/05/2021  ? Migraine   ? ? ?Past Medical History:  ?Diagnosis Date  ? Coronary artery disease   ? Hyperlipidemia   ?  Hypertension   ? Migraine   ? ? ?Past Surgical History:  ?Procedure Laterality Date  ? CESAREAN SECTION    ? x 4  ? CORONARY STENT INTERVENTION N/A 01/07/2021  ? Procedure: CORONARY STENT INTERVENTION;  Surgeon: Elder NegusPatwardhan, Manish J, MD;  [facility];  Service: Cardiovascular;  Laterality: N/A;  ? ELBOW SURGERY    ? HYSTEROSCOPY    ? INTRAVASCULAR ULTRASOUND/IVUS N/A 01/07/2021  ? Procedure: Intravascular Ultrasound/IVUS;  Surgeon: Elder NegusPatwardhan, Manish J, MD;  Location: [facility];  Service: Cardiovascular;  Laterality: N/A;  ? LEFT HEART CATH AND CORONARY ANGIOGRAPHY N/A 01/07/2021  ? Procedure: LEFT HEART CATH AND CORONARY ANGIOGRAPHY;  Surgeon: Elder NegusPatwardhan, Manish J, MD;  Location: [facility];  Service: Cardiovascular;  Laterality: N/A;  ? PELVIC LAPAROSCOPY    ? TUBAL LIGATION    ? ? ?Social History  ? ?Tobacco Use  ? Smoking status: Never  ? Smokeless tobacco: Never  ?Vaping Use  ? Vaping Use: Never used  ?Substance Use Topics  ? Alcohol use: Not Currently  ?  Alcohol/week: 0.0 standard drinks  ?  Comment: Rare  ? Drug use: No  ? ? ?Family History  ?Problem Relation Age of Onset  ? Hypertension Mother   ? Heart disease Mother   ? Hypertension Father   ? Heart failure Father   ? Hypertension Brother   ?  Hypertension Sister   ? ? ?No Known Allergies ? ?Medication list has been reviewed and updated. ? ?Current Outpatient Medications on File Prior to Visit  ?Medication Sig Dispense Refill  ? aspirin EC 81 MG tablet Take 81 mg by mouth daily. Swallow whole.    ? atorvastatin (LIPITOR) 80 MG tablet TAKE 1 TABLET BY MOUTH EVERY DAY 30 tablet 5  ? BRILINTA 90 MG TABS tablet TAKE 1 TABLET BY MOUTH TWICE DAILY (Patient taking differently: Taking until January 11, 2022) 60 tablet 0  ? Cholecalciferol (VITAMIN D-3) 125 MCG (5000 UT) TABS Take 5,000 Units by mouth at bedtime.    ? losartan (COZAAR) 50 MG tablet TAKE 1 TABLET BY MOUTH DAILY. 90 tablet 1  ? metoprolol succinate (TOPROL-XL) 25 MG 24 hr  tablet TAKE 1 TABLET BY MOUTH EVERY DAY 90 tablet 1  ? pantoprazole (PROTONIX) 40 MG tablet Take 1 tablet (40 mg total) by mouth daily. 30 tablet 3  ? ?No current facility-administered medications on file prior to visit.  ? ? ?Review of Systems: ? ?As per HPI- otherwise negative. ? ? ?Physical Examination: ?Vitals:  ? 12/25/21 1056  ?BP: 124/70  ?Pulse: 78  ?Resp: 18  ?Temp: 97.6 ?F (36.4 ?C)  ?SpO2: 99%  ? ?Vitals:  ? 12/25/21 1056  ?Weight: 143 lb 12.8 oz (65.2 kg)  ?Height: 5' (1.524 m)  ? ?Body mass index is 28.08 kg/m?. ?Ideal Body Weight: Weight in (lb) to have BMI = 25: 127.7 ? ?GEN: no acute distress.  Mild overweight, looks well ?HEENT: Atraumatic, Normocephalic.  Bilateral TM wnl, oropharynx normal.  PEERL,EOMI.   ?Ears and Nose: No external deformity. ?CV: RRR, No M/G/R. No JVD. No thrill. No extra heart sounds. ?PULM: CTA B, no wheezes, crackles, rhonchi. No retractions. No resp. distress. No accessory muscle use. ?ABD: S, NT, ND, +BS. No rebound. No HSM. ?EXTR: No c/c/e ?PSYCH: Normally interactive. Conversant.  ? ? ?Assessment and Plan: ?Physical exam ? ?Essential hypertension - Plan: CBC, Comprehensive metabolic panel ? ?Screening for diabetes mellitus - Plan: Comprehensive metabolic panel, Hemoglobin A1c ? ?Coronary artery disease due to calcified coronary lesion - Plan: Lipid panel ? ?Screening for deficiency anemia - Plan: CBC ? ?Screening for thyroid disorder - Plan: TSH ? ?Fatigue, unspecified type - Plan: TSH, VITAMIN D 25 Hydroxy (Vit-D Deficiency, Fractures) ? ?Physical exam today.  Encouraged healthy diet and exercise routine ?Will plan further follow- up pending labs. ?She would be interested in restarting alternative SGLT2 inhibitor if it more affordable.  We will check her A1c.  I encouraged her to check for preferred SGLT2 with her insurance company ? ?Signed ?Abbe Amsterdam, MD ? ?Received labs as below, message to patient ?Results for orders placed or performed in visit on 12/25/21   ?CBC  ?Result Value Ref Range  ? WBC 4.9 4.0 - 10.5 K/uL  ? RBC 5.15 (H) 3.87 - 5.11 Mil/uL  ? Platelets 267.0 150.0 - 400.0 K/uL  ? Hemoglobin 13.5 12.0 - 15.0 g/dL  ? HCT 41.6 36.0 - 46.0 %  ? MCV 80.8 78.0 - 100.0 fl  ? MCHC 32.5 30.0 - 36.0 g/dL  ? RDW 17.0 (H) 11.5 - 15.5 %  ?Comprehensive metabolic panel  ?Result Value Ref Range  ? Sodium 139 135 - 145 mEq/L  ? Potassium 4.4 3.5 - 5.1 mEq/L  ? Chloride 100 96 - 112 mEq/L  ? CO2 30 19 - 32 mEq/L  ? Glucose, Bld 80 70 - 99 mg/dL  ? BUN  10 6 - 23 mg/dL  ? Creatinine, Ser 0.93 0.40 - 1.20 mg/dL  ? Total Bilirubin 0.5 0.2 - 1.2 mg/dL  ? Alkaline Phosphatase 119 (H) 39 - 117 U/L  ? AST 51 (H) 0 - 37 U/L  ? ALT 68 (H) 0 - 35 U/L  ? Total Protein 7.4 6.0 - 8.3 g/dL  ? Albumin 4.7 3.5 - 5.2 g/dL  ? GFR 67.96 >60.00 mL/min  ? Calcium 10.2 8.4 - 10.5 mg/dL  ?Hemoglobin A1c  ?Result Value Ref Range  ? Hgb A1c MFr Bld 6.1 4.6 - 6.5 %  ?Lipid panel  ?Result Value Ref Range  ? Cholesterol 168 0 - 200 mg/dL  ? Triglycerides 112.0 0.0 - 149.0 mg/dL  ? HDL 96.70 >39.00 mg/dL  ? VLDL 22.4 0.0 - 40.0 mg/dL  ? LDL Cholesterol 49 0 - 99 mg/dL  ? Total CHOL/HDL Ratio 2   ? NonHDL 71.21   ?TSH  ?Result Value Ref Range  ? TSH 1.80 0.35 - 5.50 uIU/mL  ?VITAMIN D 25 Hydroxy (Vit-D Deficiency, Fractures)  ?Result Value Ref Range  ? VITD 51.13 30.00 - 100.00 ng/mL  ? ? ? ?

## 2021-12-25 ENCOUNTER — Ambulatory Visit (INDEPENDENT_AMBULATORY_CARE_PROVIDER_SITE_OTHER): Payer: BC Managed Care – PPO | Admitting: Family Medicine

## 2021-12-25 ENCOUNTER — Encounter: Payer: Self-pay | Admitting: Family Medicine

## 2021-12-25 VITALS — BP 124/70 | HR 78 | Temp 97.6°F | Resp 18 | Ht 60.0 in | Wt 143.8 lb

## 2021-12-25 DIAGNOSIS — Z0000 Encounter for general adult medical examination without abnormal findings: Secondary | ICD-10-CM | POA: Diagnosis not present

## 2021-12-25 DIAGNOSIS — Z130 Encounter for screening for diseases of the blood and blood-forming organs and certain disorders involving the immune mechanism: Secondary | ICD-10-CM

## 2021-12-25 DIAGNOSIS — I2584 Coronary atherosclerosis due to calcified coronary lesion: Secondary | ICD-10-CM

## 2021-12-25 DIAGNOSIS — R5383 Other fatigue: Secondary | ICD-10-CM | POA: Diagnosis not present

## 2021-12-25 DIAGNOSIS — R7401 Elevation of levels of liver transaminase levels: Secondary | ICD-10-CM

## 2021-12-25 DIAGNOSIS — Z1329 Encounter for screening for other suspected endocrine disorder: Secondary | ICD-10-CM | POA: Diagnosis not present

## 2021-12-25 DIAGNOSIS — I2510 Atherosclerotic heart disease of native coronary artery without angina pectoris: Secondary | ICD-10-CM

## 2021-12-25 DIAGNOSIS — I10 Essential (primary) hypertension: Secondary | ICD-10-CM

## 2021-12-25 DIAGNOSIS — I25119 Atherosclerotic heart disease of native coronary artery with unspecified angina pectoris: Secondary | ICD-10-CM

## 2021-12-25 DIAGNOSIS — Z131 Encounter for screening for diabetes mellitus: Secondary | ICD-10-CM

## 2021-12-25 LAB — COMPREHENSIVE METABOLIC PANEL
ALT: 68 U/L — ABNORMAL HIGH (ref 0–35)
AST: 51 U/L — ABNORMAL HIGH (ref 0–37)
Albumin: 4.7 g/dL (ref 3.5–5.2)
Alkaline Phosphatase: 119 U/L — ABNORMAL HIGH (ref 39–117)
BUN: 10 mg/dL (ref 6–23)
CO2: 30 mEq/L (ref 19–32)
Calcium: 10.2 mg/dL (ref 8.4–10.5)
Chloride: 100 mEq/L (ref 96–112)
Creatinine, Ser: 0.93 mg/dL (ref 0.40–1.20)
GFR: 67.96 mL/min (ref >60.00)
Glucose, Bld: 80 mg/dL (ref 70–99)
Potassium: 4.4 mEq/L (ref 3.5–5.1)
Sodium: 139 mEq/L (ref 135–145)
Total Bilirubin: 0.5 mg/dL (ref 0.2–1.2)
Total Protein: 7.4 g/dL (ref 6.0–8.3)

## 2021-12-25 LAB — CBC
HCT: 41.6 % (ref 36.0–46.0)
Hemoglobin: 13.5 g/dL (ref 12.0–15.0)
MCHC: 32.5 g/dL (ref 30.0–36.0)
MCV: 80.8 fl (ref 78.0–100.0)
Platelets: 267 10*3/uL (ref 150.0–400.0)
RBC: 5.15 Mil/uL — ABNORMAL HIGH (ref 3.87–5.11)
RDW: 17 % — ABNORMAL HIGH (ref 11.5–15.5)
WBC: 4.9 10*3/uL (ref 4.0–10.5)

## 2021-12-25 LAB — LIPID PANEL
Cholesterol: 168 mg/dL (ref 0–200)
HDL: 96.7 mg/dL (ref >39.00)
LDL Cholesterol: 49 mg/dL (ref 0–99)
NonHDL: 71.21
Total CHOL/HDL Ratio: 2
Triglycerides: 112 mg/dL (ref 0.0–149.0)
VLDL: 22.4 mg/dL (ref 0.0–40.0)

## 2021-12-25 LAB — HEMOGLOBIN A1C: Hgb A1c MFr Bld: 6.1 % (ref 4.6–6.5)

## 2021-12-25 LAB — TSH: TSH: 1.8 u[IU]/mL (ref 0.35–5.50)

## 2021-12-25 LAB — VITAMIN D 25 HYDROXY (VIT D DEFICIENCY, FRACTURES): VITD: 51.13 ng/mL (ref 30.00–100.00)

## 2021-12-25 NOTE — Addendum Note (Signed)
Addended by: Abbe Amsterdam C on: 12/25/2021 07:32 PM ? ? Modules accepted: Orders ? ?

## 2021-12-31 ENCOUNTER — Other Ambulatory Visit: Payer: Self-pay | Admitting: Cardiology

## 2021-12-31 DIAGNOSIS — I2510 Atherosclerotic heart disease of native coronary artery without angina pectoris: Secondary | ICD-10-CM

## 2021-12-31 DIAGNOSIS — Z955 Presence of coronary angioplasty implant and graft: Secondary | ICD-10-CM

## 2022-01-08 ENCOUNTER — Encounter: Payer: Self-pay | Admitting: Cardiology

## 2022-01-08 MED ORDER — EMPAGLIFLOZIN 10 MG PO TABS: 10.0000 mg | ORAL_TABLET | Freq: Every day | ORAL | 3 refills | Status: AC

## 2022-01-08 NOTE — Telephone Encounter (Signed)
From patient.

## 2022-01-28 ENCOUNTER — Other Ambulatory Visit: Payer: Self-pay | Admitting: Family Medicine

## 2022-01-28 DIAGNOSIS — I10 Essential (primary) hypertension: Secondary | ICD-10-CM

## 2022-02-09 ENCOUNTER — Other Ambulatory Visit: Payer: Self-pay | Admitting: Cardiology

## 2022-02-09 DIAGNOSIS — I209 Angina pectoris, unspecified: Secondary | ICD-10-CM

## 2022-02-13 DIAGNOSIS — N941 Unspecified dyspareunia: Secondary | ICD-10-CM | POA: Diagnosis not present

## 2022-02-13 DIAGNOSIS — F520 Hypoactive sexual desire disorder: Secondary | ICD-10-CM | POA: Diagnosis not present

## 2022-02-13 DIAGNOSIS — N951 Menopausal and female climacteric states: Secondary | ICD-10-CM | POA: Diagnosis not present

## 2022-02-13 DIAGNOSIS — R9389 Abnormal findings on diagnostic imaging of other specified body structures: Secondary | ICD-10-CM | POA: Diagnosis not present

## 2022-03-14 ENCOUNTER — Other Ambulatory Visit (HOSPITAL_BASED_OUTPATIENT_CLINIC_OR_DEPARTMENT_OTHER): Payer: Self-pay

## 2022-03-15 ENCOUNTER — Other Ambulatory Visit: Payer: Self-pay | Admitting: Cardiology

## 2022-03-15 DIAGNOSIS — I209 Angina pectoris, unspecified: Secondary | ICD-10-CM

## 2022-07-29 ENCOUNTER — Other Ambulatory Visit: Payer: Self-pay | Admitting: Family Medicine

## 2022-07-29 DIAGNOSIS — I10 Essential (primary) hypertension: Secondary | ICD-10-CM

## 2022-08-11 ENCOUNTER — Encounter: Payer: Self-pay | Admitting: Cardiology

## 2022-08-11 ENCOUNTER — Other Ambulatory Visit: Payer: Self-pay

## 2022-08-11 DIAGNOSIS — I209 Angina pectoris, unspecified: Secondary | ICD-10-CM

## 2022-08-11 MED ORDER — METOPROLOL SUCCINATE ER 25 MG PO TB24: 25.0000 mg | ORAL_TABLET | Freq: Every day | ORAL | 1 refills | Status: DC

## 2022-09-01 ENCOUNTER — Other Ambulatory Visit: Payer: Self-pay | Admitting: Family Medicine

## 2022-09-01 DIAGNOSIS — I10 Essential (primary) hypertension: Secondary | ICD-10-CM

## 2022-09-02 ENCOUNTER — Other Ambulatory Visit: Payer: Self-pay | Admitting: Family Medicine

## 2022-09-02 DIAGNOSIS — I10 Essential (primary) hypertension: Secondary | ICD-10-CM

## 2022-09-12 ENCOUNTER — Ambulatory Visit: Payer: BC Managed Care – PPO | Admitting: Cardiology

## 2022-09-12 ENCOUNTER — Encounter: Payer: Self-pay | Admitting: Cardiology

## 2022-09-12 VITALS — BP 137/83 | HR 73 | Resp 14 | Ht 60.0 in | Wt 143.0 lb

## 2022-09-12 DIAGNOSIS — I2510 Atherosclerotic heart disease of native coronary artery without angina pectoris: Secondary | ICD-10-CM | POA: Diagnosis not present

## 2022-09-12 DIAGNOSIS — I10 Essential (primary) hypertension: Secondary | ICD-10-CM | POA: Diagnosis not present

## 2022-09-12 DIAGNOSIS — Z8249 Family history of ischemic heart disease and other diseases of the circulatory system: Secondary | ICD-10-CM

## 2022-09-12 DIAGNOSIS — Z955 Presence of coronary angioplasty implant and graft: Secondary | ICD-10-CM

## 2022-09-12 DIAGNOSIS — E782 Mixed hyperlipidemia: Secondary | ICD-10-CM

## 2022-09-12 NOTE — Progress Notes (Unsigned)
ID:  Theresa Bass, DOB 03/12/1966, MRN [id number]  PCP:  Pearline Cables, MD  Cardiologist:  Tessa Lerner, DO, [facility] (established care 01/02/2021)  Date: 09/12/2022 Last Office Visit: 09/13/2021   Chief Complaint  Patient presents with   Follow-up    1 year   Coronary Artery Disease    HPI  Theresa Bass is a 56 y.o. female whose past medical history and cardiovascular risk factors include: Premature CAD status post PCI to the LAD (April 2022), postmenopausal female, hypertension, on hormone replacement therapy, family history of premature coronary artery disease.  Patient presents to the office for 1 year follow-up given her underlying CAD.  In April 2022 she presented with symptoms of unstable angina.  Underwent elective heart catheterization and was noted to have disease in the LAD distribution and underwent angioplasty and stent.  Since then she has done well and now presents for 1 year follow-up visit.  Over the last 1 year patient is doing well from a cardiovascular standpoint.  No hospitalizations or urgent care visits for cardiovascular reasons.  Overall functional capacity has improved since last year.  She now is walking at least 6 to 7 miles per day.  Family history of premature coronary artery disease: Mom at the age of 64 had 2 PCI's performed due to an abnormal stress test.  ALLERGIES: No Known Allergies  MEDICATION LIST PRIOR TO VISIT: Current Meds  Medication Sig   aspirin EC 81 MG tablet Take 81 mg by mouth daily. Swallow whole.   atorvastatin (LIPITOR) 80 MG tablet TAKE 1 TABLET BY MOUTH EVERY DAY (Patient taking differently: Take 40 mg by mouth daily.)   Cholecalciferol (VITAMIN D-3) 125 MCG (5000 UT) TABS Take 5,000 Units by mouth at bedtime.   JARDIANCE 10 MG TABS tablet Take 10 mg by mouth daily.   losartan (COZAAR) 50 MG tablet Take 1 tablet (50 mg total) by mouth daily.   metoprolol succinate (TOPROL-XL) 25 MG 24 hr tablet Take 1 tablet (25 mg  total) by mouth daily.     PAST MEDICAL HISTORY: Past Medical History:  Diagnosis Date   Coronary artery disease    Hyperlipidemia    Hypertension    Migraine     PAST SURGICAL HISTORY: Past Surgical History:  Procedure Laterality Date   CESAREAN SECTION     x 4   CORONARY STENT INTERVENTION N/A 01/07/2021   Procedure: CORONARY STENT INTERVENTION;  Surgeon: Elder Negus, MD;  [facility]: [facility];  Service: Cardiovascular;  Laterality: N/A;   ELBOW SURGERY     HYSTEROSCOPY     INTRAVASCULAR ULTRASOUND/IVUS N/A 01/07/2021   Procedure: Intravascular Ultrasound/IVUS;  Surgeon: Elder Negus, MD;  [facility]: [facility];  Service: Cardiovascular;  Laterality: N/A;   LEFT HEART CATH AND CORONARY ANGIOGRAPHY N/A 01/07/2021   Procedure: LEFT HEART CATH AND CORONARY ANGIOGRAPHY;  Surgeon: Elder Negus, MD;  [facility]: [facility];  Service: Cardiovascular;  Laterality: N/A;   PELVIC LAPAROSCOPY     TUBAL LIGATION      FAMILY HISTORY: The patient family history includes Heart disease in her mother; Heart failure in her father; Hypertension in her brother, father, mother, and sister.  SOCIAL HISTORY:  The patient  reports that she has never smoked. She has never used smokeless tobacco. She reports that she does not currently use alcohol. She reports that she does not use drugs.  REVIEW OF SYSTEMS: Review of Systems  Constitutional: Negative for  chills and fever.  HENT:  Negative for hoarse voice and nosebleeds.   Eyes:  Negative for discharge, double vision and pain.  Cardiovascular:  Negative for chest pain, claudication, dyspnea on exertion, leg swelling, near-syncope, orthopnea, palpitations, paroxysmal nocturnal dyspnea and syncope.  Respiratory:  Negative for hemoptysis and shortness of breath.   Musculoskeletal:  Negative for muscle cramps and myalgias.  Gastrointestinal:  Negative for abdominal pain, constipation, diarrhea, heartburn,  hematemesis, hematochezia, melena, nausea and vomiting.  Neurological:  Negative for dizziness and light-headedness.    PHYSICAL EXAM:    09/12/2022   10:35 AM 12/25/2021   10:56 AM 09/13/2021   10:14 AM  Vitals with BMI  Height 5\' 0"  5\' 0"  5\' 0"   Weight 143 lbs 143 lbs 13 oz 141 lbs  BMI 27.93 28.08 27.54  Systolic 137 124  Diastolic 83 70 84  Pulse 73 78 62   Physical Exam  Constitutional: No distress.  Age appropriate, hemodynamically stable.   Neck: No JVD present.  Cardiovascular: Normal rate, regular rhythm, S1 normal, S2 normal, intact distal pulses and normal pulses. Exam reveals no gallop, no S3 and no S4.  No murmur heard. Pulmonary/Chest: Effort normal and breath sounds normal. No stridor. She has no wheezes. She has no rales.  Abdominal: Soft. Bowel sounds are normal. She exhibits no distension. There is no abdominal tenderness.  Musculoskeletal:        General: No edema.     Cervical back: Neck supple.  Neurological: She is alert and oriented to person, place, and time. She has intact cranial nerves (2-12).  Skin: Skin is warm and moist.   CARDIAC DATABASE: EKG: 09/12/2022: Sinus rhythm, 64 bpm, consider old anterior infarct, without underlying ischemia or injury pattern.  Echocardiogram: 01/06/2021: LVEF 50-55%, with regional wall motion abnormalities, grade 1 diastolic impairment, trivial MR.  Heart Catheterization: 01/07/2021:  LM: Normal LAD: Mid 95% stenosis LCx: Normal RCA: Normal   Successful IVUS guided percutaneous coronary intervention mid LAD PTCA and stent placement 3.5 X 20 mm Synergy drug-eluting stent 0% residual stenosis  LABORATORY DATA:    Latest Ref Rng & Units 12/25/2021   11:23 AM 02/23/2021    9:30 PM 01/07/2021    1:03 AM  CBC  WBC 4.0 - 10.5 K/uL 4.9  7.0  5.6   Hemoglobin 12.0 - 15.0 g/dL 12/27/2021  02/25/2021  03/09/2021   Hematocrit 36.0 - 46.0 % 41.6  34.1  37.4   Platelets 150.0 - 400.0 K/uL 267.0  391  240        Latest Ref Rng &  Units 12/25/2021   11:23 AM 02/23/2021    9:30 PM 01/07/2021    6:15 AM  CMP  Glucose 70 - 99 mg/dL 80  12/27/2021  88   BUN 6 - 23 mg/dL 10  13  6    Creatinine 0.40 - 1.20 mg/dL 02/25/2021  03/09/2021  182   Sodium 135 - 145 mEq/L 139  140  137   Potassium 3.5 - 5.1 mEq/L 4.4  3.6  3.9   Chloride 96 - 112 mEq/L 100  105  107   CO2 19 - 32 mEq/L 30  25  27    Calcium 8.4 - 10.5 mg/dL  9.5  8.2   Total Protein 6.0 - 8.3 g/dL 7.4  8.0    Total Bilirubin 0.2 - 1.2 mg/dL 0.5  0.4    Alkaline Phos 39 - 117 U/L 119  87    AST 0 -  37 U/L 51  23    ALT 0 - 35 U/L 68  24      Lipid Panel  Lab Results  Component Value Date   CHOL 168 12/25/2021   HDL 96.70 12/25/2021   LDLCALC 49 12/25/2021   LDLDIRECT 105 (H) 01/03/2021   TRIG 112.0 12/25/2021   CHOLHDL 2 12/25/2021   No components found for: "NTPROBNP" No results for input(s): "PROBNP" in the last 8760 hours. Recent Labs    12/25/21 1123  TSH 1.80    BMP Recent Labs    12/25/21 1123  NA 139  K 4.4  CL 100  CO2 30  GLUCOSE 80  BUN 10  CREATININE 0.93  CALCIUM 10.2    HEMOGLOBIN A1C Lab Results  Component Value Date   HGBA1C 6.1 12/25/2021    IMPRESSION:    ICD-10-CM   1. Atherosclerosis of native coronary artery of native heart without angina pectoris  I25.10 EKG 12-Lead    2. History of coronary angioplasty with insertion of stent  Z95.5     3. Benign hypertension  I10     4. Mixed hyperlipidemia  E78.2     5. Family history of premature CAD  Z82.49        RECOMMENDATIONS: Theresa Bass is a 56 y.o. female whose past medical history and cardiac risk factors include: Premature CAD status post PCI to the LAD (April 2022), postmenopausal female, hypertension, on hormone replacement therapy, family history of premature coronary artery disease.  Atherosclerosis of native coronary artery of native heart without angina pectoris /prior history of PCI to the LAD. 1 year follow-up visit. No angina pectoris. EKG:  Nonischemic. No use of sublingual nitroglycerin tablets. Medications reconciled. Ischemic workup reviewed as part of today's office visit no additional workup warranted at this time. Reeducated her on the importance of improving her modifiable cardiovascular risk factors.  Benign hypertension Office blood pressures are well-controlled. Medications reconciled. Reemphasized the importance of low-salt diet and moderate intensity exercise 30 minutes a day 5 days a week. No medication changes warranted at this time  Mixed hyperlipidemia Currently on atorvastatin.   She denies myalgia or other side effects. Most recent lipids dated March 2023, independently reviewed as noted above. Patient is due for yearly physical in March 2024 when she will have a fasting lipids. Currently managed by primary care provider.  FINAL MEDICATION LIST END OF ENCOUNTER: No orders of the defined types were placed in this encounter.   Medications Discontinued During This Encounter  Medication Reason   BRILINTA 90 MG TABS tablet Discontinued by provider   pantoprazole (PROTONIX) 40 MG tablet Patient Preference     Current Outpatient Medications:    aspirin EC 81 MG tablet, Take 81 mg by mouth daily. Swallow whole., Disp: , Rfl:    atorvastatin (LIPITOR) 80 MG tablet, TAKE 1 TABLET BY MOUTH EVERY DAY (Patient taking differently: Take 40 mg by mouth daily.), Disp: 30 tablet, Rfl: 5   Cholecalciferol (VITAMIN D-3) 125 MCG (5000 UT) TABS, Take 5,000 Units by mouth at bedtime., Disp: , Rfl:    JARDIANCE 10 MG TABS tablet, Take 10 mg by mouth daily., Disp: , Rfl:    losartan (COZAAR) 50 MG tablet, Take 1 tablet (50 mg total) by mouth daily., Disp: 30 tablet, Rfl: 0   metoprolol succinate (TOPROL-XL) 25 MG 24 hr tablet, Take 1 tablet (25 mg total) by mouth daily., Disp: 90 tablet, Rfl: 1  Orders Placed This Encounter  Procedures  EKG 12-Lead    There are no Patient Instructions on file for this visit.    --Continue cardiac medications as reconciled in final medication list. --Return in about 2 years (around 12/28/2024) for Follow up, CAD. Or sooner if needed. --Continue follow-up with your primary care physician regarding the management of your other chronic comorbid conditions.  Patient's questions and concerns were addressed to her satisfaction. She voices understanding of the instructions provided during this encounter.   This note was created using a voice recognition software as a result there may be grammatical errors inadvertently enclosed that do not reflect the nature of this encounter. Every attempt is made to correct such errors.  Tessa Lerner, [place], [facility]  Pager: [phone] Office: [phone]

## 2022-10-02 ENCOUNTER — Other Ambulatory Visit: Payer: Self-pay | Admitting: Family Medicine

## 2022-10-02 DIAGNOSIS — I10 Essential (primary) hypertension: Secondary | ICD-10-CM

## 2022-10-03 MED ORDER — LOSARTAN POTASSIUM 50 MG PO TABS: 50.0000 mg | ORAL_TABLET | Freq: Every day | ORAL | 0 refills | Status: DC

## 2022-10-06 ENCOUNTER — Encounter: Payer: Self-pay | Admitting: Cardiology

## 2022-10-21 DIAGNOSIS — R7982 Elevated C-reactive protein (CRP): Secondary | ICD-10-CM | POA: Diagnosis not present

## 2022-10-21 DIAGNOSIS — N959 Unspecified menopausal and perimenopausal disorder: Secondary | ICD-10-CM | POA: Diagnosis not present

## 2022-10-21 DIAGNOSIS — E039 Hypothyroidism, unspecified: Secondary | ICD-10-CM | POA: Diagnosis not present

## 2022-10-21 DIAGNOSIS — E279 Disorder of adrenal gland, unspecified: Secondary | ICD-10-CM | POA: Diagnosis not present

## 2022-10-21 DIAGNOSIS — E538 Deficiency of other specified B group vitamins: Secondary | ICD-10-CM | POA: Diagnosis not present

## 2022-10-21 DIAGNOSIS — R7303 Prediabetes: Secondary | ICD-10-CM | POA: Diagnosis not present

## 2022-10-21 DIAGNOSIS — E559 Vitamin D deficiency, unspecified: Secondary | ICD-10-CM | POA: Diagnosis not present

## 2022-10-21 LAB — BASIC METABOLIC PANEL
BUN: 12 (ref 4–21)
CO2: 32 — AB (ref 13–22)
Chloride: 103 (ref 99–108)
Creatinine: 0.9 (ref 0.5–1.1)
Glucose: 94
Potassium: 4.1 mEq/L (ref 3.5–5.1)
Sodium: 138 (ref 137–147)

## 2022-10-21 LAB — CBC AND DIFFERENTIAL
HCT: 44 (ref 36–46)
Hemoglobin: 14.8 (ref 12.0–16.0)
Platelets: 265 10*3/uL (ref 150–400)
WBC: 4.7

## 2022-10-21 LAB — HEMOGLOBIN A1C: Hemoglobin A1C: 5.8

## 2022-10-21 LAB — COMPREHENSIVE METABOLIC PANEL
Albumin: 4.6 (ref 3.5–5.0)
Calcium: 9.6 (ref 8.7–10.7)

## 2022-10-21 LAB — HEPATIC FUNCTION PANEL
ALT: 34 U/L (ref 7–35)
AST: 29 (ref 13–35)
Bilirubin, Total: 1

## 2022-10-21 LAB — TSH: TSH: 1.91 (ref 0.41–5.90)

## 2022-11-03 ENCOUNTER — Other Ambulatory Visit: Payer: Self-pay | Admitting: Family Medicine

## 2022-11-03 DIAGNOSIS — I10 Essential (primary) hypertension: Secondary | ICD-10-CM

## 2022-11-03 MED ORDER — LOSARTAN POTASSIUM 50 MG PO TABS: 50.0000 mg | ORAL_TABLET | Freq: Every day | ORAL | 0 refills | Status: DC

## 2022-11-04 DIAGNOSIS — E039 Hypothyroidism, unspecified: Secondary | ICD-10-CM | POA: Diagnosis not present

## 2022-11-04 DIAGNOSIS — E538 Deficiency of other specified B group vitamins: Secondary | ICD-10-CM | POA: Diagnosis not present

## 2022-11-04 DIAGNOSIS — E279 Disorder of adrenal gland, unspecified: Secondary | ICD-10-CM | POA: Diagnosis not present

## 2022-11-04 DIAGNOSIS — N959 Unspecified menopausal and perimenopausal disorder: Secondary | ICD-10-CM | POA: Diagnosis not present

## 2022-11-19 ENCOUNTER — Encounter: Payer: Self-pay | Admitting: Cardiology

## 2022-12-18 ENCOUNTER — Encounter: Payer: Self-pay | Admitting: Cardiology

## 2022-12-18 ENCOUNTER — Encounter: Payer: Self-pay | Admitting: Family Medicine

## 2022-12-18 ENCOUNTER — Ambulatory Visit: Payer: BC Managed Care – PPO | Admitting: Family Medicine

## 2022-12-18 VITALS — BP 116/68 | HR 72 | Ht 60.0 in | Wt 143.8 lb

## 2022-12-18 DIAGNOSIS — R82998 Other abnormal findings in urine: Secondary | ICD-10-CM | POA: Diagnosis not present

## 2022-12-18 DIAGNOSIS — R300 Dysuria: Secondary | ICD-10-CM | POA: Diagnosis not present

## 2022-12-18 LAB — POC URINALSYSI DIPSTICK (AUTOMATED)
Bilirubin, UA: NEGATIVE
Blood, UA: NEGATIVE
Glucose, UA: NEGATIVE
Ketones, UA: NEGATIVE
Nitrite, UA: NEGATIVE
Protein, UA: NEGATIVE
Spec Grav, UA: 1.015 (ref 1.010–1.025)
Urobilinogen, UA: 0.2 E.U./dL
pH, UA: 5 (ref 5.0–8.0)

## 2022-12-18 MED ORDER — SULFAMETHOXAZOLE-TRIMETHOPRIM 800-160 MG PO TABS: 1.0000 | ORAL_TABLET | Freq: Two times a day (BID) | ORAL | 0 refills | Status: AC

## 2022-12-18 NOTE — Progress Notes (Signed)
Acute Office Visit  Subjective:     Patient ID: Theresa Bass, female    DOB: 07-27-1966, 57 y.o.   MRN: [id number]  CC: UTI symtpoms   HPI   Urinary Tract Infection: Patient complains of dysuria, frequency, suprapubic pressure, and urgency She has had symptoms for 4 days. Patient denies fever, chills, abdominal pain, nausea, vomiting, diarrhea, vaginal discharge. Patient does not have a history of recurrent UTI.  Patient does not have a history of pyelonephritis. She got some temporary relief with Azo.       ROS All review of systems negative except what is listed in the HPI      Objective:    BP 116/68   Pulse 72   Ht 5' (1.524 m)   Wt 143 lb 12.8 oz (65.2 kg)   LMP 12/22/2020 (Approximate) Comment: states she takes HRT  BMI 28.08 kg/m    Physical Exam Vitals reviewed.  Constitutional:      Appearance: Normal appearance.  Cardiovascular:     Rate and Rhythm: Normal rate and regular rhythm.     Pulses: Normal pulses.     Heart sounds: Normal heart sounds.  Pulmonary:     Effort: Pulmonary effort is normal.     Breath sounds: Normal breath sounds.  Abdominal:     General: There is no distension.     Tenderness: There is no right CVA tenderness, left CVA tenderness or guarding.  Skin:    General: Skin is warm and dry.  Neurological:     Mental Status: She is alert and oriented to person, place, and time.  Psychiatric:        Mood and Affect: Mood normal.        Behavior: Behavior normal.        Thought Content: Thought content normal.        Judgment: Judgment normal.        Results for orders placed or performed in visit on 12/18/22  POCT Urinalysis Dipstick (Automated)  Result Value Ref Range   Color, UA yellow    Clarity, UA clear    Glucose, UA Negative Negative   Bilirubin, UA neg    Ketones, UA neg    Spec Grav, UA 1.015 1.010 - 1.025   Blood, UA neg    pH, UA 5.0 5.0 - 8.0   Protein, UA Negative Negative   Urobilinogen, UA 0.2 0.2  or 1.0 E.U./dL   Nitrite, UA neg    Leukocytes, UA Moderate (2+) (A) Negative        Assessment & Plan:   Problem List Items Addressed This Visit   None Visit Diagnoses     Dysuria    -  Primary   Relevant Orders   POCT Urinalysis Dipstick (Automated) (Completed)   Urine Culture   Leukocytes in urine       Relevant Medications   sulfamethoxazole-trimethoprim (BACTRIM DS) 800-160 MG tablet   Other Relevant Orders   Urine Culture      UA with leuks - sending for culture. It will likely result over the weekend and given her symptoms, will go ahead and treat empirically with Bactrim. Will make adjustments pending culture if needed. Okay to continue Azo, cranberry supplement. Increase oral hydration and continue good hygiene. Patient aware of signs/symptoms requiring further/urgent evaluation.     Meds ordered this encounter  Medications   sulfamethoxazole-trimethoprim (BACTRIM DS) 800-160 MG tablet    Sig: Take 1 tablet by mouth 2 (two) times  daily for 3 days.    Dispense:  6 tablet    Refill:  0    Order Specific Question:   Supervising Provider    Answer:   Penni Homans A [[id number]]    Return if symptoms worsen or fail to improve.  Terrilyn Saver, NP

## 2022-12-20 LAB — URINE CULTURE
MICRO NUMBER:: 14723598
SPECIMEN QUALITY:: ADEQUATE

## 2022-12-26 ENCOUNTER — Other Ambulatory Visit: Payer: Self-pay | Admitting: Cardiology

## 2022-12-26 DIAGNOSIS — I209 Angina pectoris, unspecified: Secondary | ICD-10-CM

## 2022-12-27 NOTE — Progress Notes (Unsigned)
[place] [facility] [address] [place], [place] [address] [phone]  Date:  01/01/2023   Name:  Theresa Bass   DOB:  1965-12-24   MRN:  [id number]  PCP:  Darreld Mclean, MD    Chief Complaint: No chief complaint on file.   History of Present Illness:  Theresa Bass is a 57 y.o. very pleasant female patient who presents with the following:  Pt seen today for a CPE Last visit with myself was in March of last year  History of hypertension, migraine headache, CAD-she was admitted 1 year ago and had a PCI   Pap Mammo 12/22 Colon cancer screening- cologuard 8/21 Labs one year ago   Patient Active Problem List   Diagnosis Date Noted   Unstable angina ([place]) 01/05/2021   Migraine     Past Medical History:  Diagnosis Date   Coronary artery disease    Hyperlipidemia    Hypertension    Migraine     Past Surgical History:  Procedure Laterality Date   CESAREAN SECTION     x 4   CORONARY STENT INTERVENTION N/A 01/07/2021   Procedure: CORONARY STENT INTERVENTION;  Surgeon: Nigel Mormon, MD;  Location: [facility];  Service: Cardiovascular;  Laterality: N/A;   ELBOW SURGERY     HYSTEROSCOPY     INTRAVASCULAR ULTRASOUND/IVUS N/A 01/07/2021   Procedure: Intravascular Ultrasound/IVUS;  Surgeon: Nigel Mormon, MD;  Location: [facility];  Service: Cardiovascular;  Laterality: N/A;   LEFT HEART CATH AND CORONARY ANGIOGRAPHY N/A 01/07/2021   Procedure: LEFT HEART CATH AND CORONARY ANGIOGRAPHY;  Surgeon: Nigel Mormon, MD;  Location: [facility];  Service: Cardiovascular;  Laterality: N/A;   PELVIC LAPAROSCOPY     TUBAL LIGATION      Social History   Tobacco Use   Smoking status: Never   Smokeless tobacco: Never  Vaping Use   Vaping Use: Never used  Substance Use Topics   Alcohol use: Not Currently    Alcohol/week: 0.0 standard drinks of alcohol    Comment: Rare    Drug use: No    Family History  Problem Relation Age of Onset   Hypertension Mother    Heart disease Mother    Hypertension Father    Heart failure Father    Hypertension Brother    Hypertension Sister     No Known Allergies  Medication list has been reviewed and updated.  Current Outpatient Medications on File Prior to Visit  Medication Sig Dispense Refill   Ascorbic Acid (VITAMIN C PO) Take by mouth.     aspirin EC 81 MG tablet Take 81 mg by mouth daily. Swallow whole.     Cholecalciferol (VITAMIN D-3) 125 MCG (5000 UT) TABS Take 5,000 Units by mouth at bedtime.     JARDIANCE 10 MG TABS tablet Take 10 mg by mouth daily.     losartan (COZAAR) 50 MG tablet Take 1 tablet (50 mg total) by mouth daily. 90 tablet 0   metoprolol succinate (TOPROL-XL) 25 MG 24 hr tablet Take 1 tablet (25 mg total) by mouth daily. 90 tablet 1   Misc Natural Products (CORTISOL MANAGER PO) Take by mouth.     Multiple Vitamins-Minerals (ZINC PO) Take by mouth.     Prasterone, DHEA, (DHEA PO) Take by mouth.     No current facility-administered medications on file prior to visit.  Review of Systems:  As per HPI- otherwise negative.   Physical Examination: There were no vitals filed for this visit. There were no vitals filed for this visit. There is no height or weight on file to calculate BMI. Ideal Body Weight:    GEN: no acute distress. HEENT: Atraumatic, Normocephalic.  Ears and Nose: No external deformity. CV: RRR, No M/G/R. No JVD. No thrill. No extra heart sounds. PULM: CTA B, no wheezes, crackles, rhonchi. No retractions. No resp. distress. No accessory muscle use. ABD: S, NT, ND, +BS. No rebound. No HSM. EXTR: No c/c/e PSYCH: Normally interactive. Conversant.    Assessment and Plan: *** Physical exam today- encouraged healthy diet and exercise routine Will plan further follow- up pending labs.  Signed Lamar Blinks, MD

## 2023-01-01 ENCOUNTER — Ambulatory Visit (INDEPENDENT_AMBULATORY_CARE_PROVIDER_SITE_OTHER): Payer: BC Managed Care – PPO | Admitting: Family Medicine

## 2023-01-01 ENCOUNTER — Encounter: Payer: Self-pay | Admitting: Family Medicine

## 2023-01-01 VITALS — BP 118/60 | HR 68 | Temp 97.8°F | Resp 18 | Ht 60.0 in | Wt 144.0 lb

## 2023-01-01 DIAGNOSIS — Z0000 Encounter for general adult medical examination without abnormal findings: Secondary | ICD-10-CM

## 2023-01-01 DIAGNOSIS — Z1329 Encounter for screening for other suspected endocrine disorder: Secondary | ICD-10-CM

## 2023-01-01 DIAGNOSIS — R5383 Other fatigue: Secondary | ICD-10-CM

## 2023-01-01 DIAGNOSIS — Z13220 Encounter for screening for lipoid disorders: Secondary | ICD-10-CM

## 2023-01-01 DIAGNOSIS — R232 Flushing: Secondary | ICD-10-CM

## 2023-01-01 DIAGNOSIS — Z130 Encounter for screening for diseases of the blood and blood-forming organs and certain disorders involving the immune mechanism: Secondary | ICD-10-CM | POA: Diagnosis not present

## 2023-01-01 DIAGNOSIS — Z131 Encounter for screening for diabetes mellitus: Secondary | ICD-10-CM | POA: Diagnosis not present

## 2023-01-01 DIAGNOSIS — R928 Other abnormal and inconclusive findings on diagnostic imaging of breast: Secondary | ICD-10-CM

## 2023-01-01 DIAGNOSIS — I10 Essential (primary) hypertension: Secondary | ICD-10-CM | POA: Diagnosis not present

## 2023-01-01 DIAGNOSIS — I2584 Coronary atherosclerosis due to calcified coronary lesion: Secondary | ICD-10-CM

## 2023-01-01 DIAGNOSIS — R7303 Prediabetes: Secondary | ICD-10-CM | POA: Diagnosis not present

## 2023-01-01 DIAGNOSIS — I2510 Atherosclerotic heart disease of native coronary artery without angina pectoris: Secondary | ICD-10-CM

## 2023-01-01 LAB — LIPID PANEL
Cholesterol: 190 mg/dL (ref 0–200)
HDL: 85.9 mg/dL (ref >39.00)
LDL Cholesterol: 85 mg/dL (ref 0–99)
NonHDL: 104.09
Total CHOL/HDL Ratio: 2
Triglycerides: 95 mg/dL (ref 0.0–149.0)
VLDL: 19 mg/dL (ref 0.0–40.0)

## 2023-01-01 LAB — HEMOGLOBIN A1C: Hgb A1c MFr Bld: 5.8 % (ref 4.6–6.5)

## 2023-01-01 MED ORDER — ROSUVASTATIN CALCIUM 10 MG PO TABS: 10.0000 mg | ORAL_TABLET | Freq: Every day | ORAL | 3 refills | Status: DC

## 2023-01-01 NOTE — Patient Instructions (Addendum)
Good to see you again today!  Try 10mg  of crestor, if tolerated well go to 20 mg after 1-2 weeks We will check on any avenues to save money on Jardiance or a similar med Placed referral to [place] in [facility] for you  Address: [address] [place], [place], [place] [address] Phone: [phone]

## 2023-01-02 ENCOUNTER — Encounter: Payer: Self-pay | Admitting: Pharmacist

## 2023-01-02 ENCOUNTER — Other Ambulatory Visit: Payer: BC Managed Care – PPO | Admitting: Pharmacist

## 2023-01-02 ENCOUNTER — Telehealth: Payer: Self-pay | Admitting: Pharmacist

## 2023-01-02 NOTE — Telephone Encounter (Signed)
Left message with Dr Emelda Brothers nurse regarding potential options for Jardiance.  Marcelline Deist is tier 3 and same cost as Jardiance $700 until patient meets $1000 deductibe.  There is a generic for Marcelline Deist which would cost about $241 per month with GoodRx card.   I don't see that patient has cardiomyopathy diagnosis but there is a Orthoptist that would cover cost of Jardiance but only for cardiomyopathy.   Last ECHO should LVEF of 50 to 55%

## 2023-01-02 NOTE — Progress Notes (Signed)
01/02/2023 Name: Theresa Bass MRN: [id number] DOB: [birth year]  Chief Complaint  Patient presents with   Medication Management    Cost of Jardiance   Coordinated with patient's insurance company to determine potential lower cost alternative to DriggsJardiance.   Subjective:  Care Team: Primary Care Provider: Pearline Cablesopland, Jessica C, MD ; patient was seen yesterday by Dr Patsy Lageropland for physical Cardiologist: Dr Odis Hollingsheadolia; Next Scheduled Visit: 01/14/2024  Medication Access/Adherence  [facility]  [id number] [place], [place] - [address] [place] [id number] Phone: [phone] Fax: [phone]  [facility] [place], [place] - [address] [place] [address] Phone: [phone] Fax: [phone]   Patient reports affordability concerns with their medications: Yes  Patient reports access/transportation concerns to their pharmacy: No  Patient reports adherence concerns with their medications:  No      Patient has history of premature CAD with PCI in 2022. She is also noted to be postmenopausal female, on hormone replacement therapy. Has hypertension and family history of premature coronary artery disease.  Per cardiologist has prescribed Jardiance but patient reports that cost if over $700 even with manufacturer discount card.   Objective:  Lab Results  Component Value Date   HGBA1C 5.8 01/01/2023    Lab Results  Component Value Date   CREATININE 0.93 12/25/2021   BUN 10 12/25/2021   NA 139 12/25/2021   K 4.4 12/25/2021   CL 100 12/25/2021   CO2 30 12/25/2021    Lab Results  Component Value Date   CHOL 190 01/01/2023   HDL 85.90 01/01/2023   LDLCALC 85 01/01/2023   LDLDIRECT 105 (H) 01/03/2021   TRIG 95.0 01/01/2023   CHOLHDL 2 01/01/2023    Medications Reviewed Today     Reviewed by Henrene PastorEckard, Takumi Din, RPH-CPP (Pharmacist) on  01/02/23 at 1416  Med List Status: <None>   Medication Order Taking? Sig Documenting Provider Last Dose Status Informant  Ascorbic Acid (VITAMIN C PO) [id number] No Take by mouth. [provider] Taking Active   aspirin EC 81 MG tablet [id number] No Take 81 mg by mouth daily. Swallow whole. [provider] Taking Active Self  Cholecalciferol (VITAMIN D-3) 125 MCG (5000 UT) TABS [id number] No Take 5,000 Units by mouth at bedtime. [provider] Taking Active Self  JARDIANCE 10 MG TABS tablet [id number] No Take 10 mg by mouth daily. [provider] Taking Active   losartan (COZAAR) 50 MG tablet [id number] No Take 1 tablet (50 mg total) by mouth daily. Copland, Gwenlyn FoundJessica C, MD Taking Active   metoprolol succinate (TOPROL-XL) 25 MG 24 hr tablet [id number] No Take 1 tablet (25 mg total) by mouth daily. Tessa Lernerolia, Sunit, DO Taking Active   Misc Natural Products (CORTISOL MANAGER PO) [id number] No Take by mouth. [provider] Taking Active   Multiple Vitamins-Minerals (ZINC PO) 106269485423510633 No Take by mouth. [provider] Taking Active   Prasterone, DHEA, (DHEA PO) [id number] No Take by mouth. [provider] Taking Active   rosuvastatin (CRESTOR) 10 MG tablet [id number]  Take 1 tablet (10 mg total) by mouth daily. Increase to 20 mg after 1-2 weeks if tolerated Copland, Gwenlyn FoundJessica C, MD  Active   UNABLE TO FIND [id number] No Med Name: Berberine 450 [provider] Taking Active  Assessment/Plan:  CAD / ASCVD risk reduction:  Called patient's insurance. She has a high deductible plan. She must meet $1000 out of pocket deductible before copay for higher tier medications is in effect.  Per her insurance she has $0 toward deductible currently.  Jardiance and Marcelline Deist are tier 3 - cost would be $40 per month AFTER she reaches $1000 deductible.  Asked about generic Marcelline Deist / dapagliflozin but it is not on her formulary. She could use  GoodRx card and cost would be about $241 at [employer] per [employer].   We could try for medication assistance program but usually patient's with commercial insurance are excluded. Patient to let me know if she would like to apply for medication assistance program   She might also apply for [facility] if she has cardiomyopathy. I did not see if mentioned in any cardiology notes but she did have EF of 50 to 55% on ECHO in 2022.  Will forward information to cardiologist.  Henrene Pastor, PharmD Clinical Pharmacist [facility]

## 2023-01-06 ENCOUNTER — Encounter (HOSPITAL_BASED_OUTPATIENT_CLINIC_OR_DEPARTMENT_OTHER): Payer: Self-pay

## 2023-01-06 ENCOUNTER — Other Ambulatory Visit: Payer: Self-pay

## 2023-01-06 ENCOUNTER — Emergency Department (HOSPITAL_BASED_OUTPATIENT_CLINIC_OR_DEPARTMENT_OTHER)
Admission: EM | Admit: 2023-01-06 | Discharge: 2023-01-06 | Disposition: A | Discharge status: Home / Self Care | Payer: BC Managed Care – PPO | Attending: Emergency Medicine | Admitting: Emergency Medicine

## 2023-01-06 DIAGNOSIS — R1013 Epigastric pain: Secondary | ICD-10-CM | POA: Diagnosis not present

## 2023-01-06 DIAGNOSIS — R197 Diarrhea, unspecified: Secondary | ICD-10-CM | POA: Insufficient documentation

## 2023-01-06 DIAGNOSIS — Z7982 Long term (current) use of aspirin: Secondary | ICD-10-CM | POA: Diagnosis not present

## 2023-01-06 DIAGNOSIS — I2510 Atherosclerotic heart disease of native coronary artery without angina pectoris: Secondary | ICD-10-CM | POA: Diagnosis not present

## 2023-01-06 DIAGNOSIS — R112 Nausea with vomiting, unspecified: Secondary | ICD-10-CM | POA: Insufficient documentation

## 2023-01-06 DIAGNOSIS — I10 Essential (primary) hypertension: Secondary | ICD-10-CM | POA: Diagnosis not present

## 2023-01-06 LAB — CBC WITH DIFFERENTIAL/PLATELET
Abs Immature Granulocytes: 0.01 10*3/uL (ref 0.00–0.07)
Basophils Absolute: 0 10*3/uL (ref 0.0–0.1)
Basophils Relative: 0 %
Eosinophils Absolute: 0.1 10*3/uL (ref 0.0–0.5)
Eosinophils Relative: 1 %
HCT: 44.7 % (ref 36.0–46.0)
Hemoglobin: 14.6 g/dL (ref 12.0–15.0)
Immature Granulocytes: 0 %
Lymphocytes Relative: 5 %
Lymphs Abs: 0.3 10*3/uL — ABNORMAL LOW (ref 0.7–4.0)
MCH: 28.6 pg (ref 26.0–34.0)
MCHC: 32.7 g/dL (ref 30.0–36.0)
MCV: 87.5 fL (ref 80.0–100.0)
Monocytes Absolute: 0.3 10*3/uL (ref 0.1–1.0)
Monocytes Relative: 5 %
Neutro Abs: 5.1 10*3/uL (ref 1.7–7.7)
Neutrophils Relative %: 89 %
Platelets: 217 10*3/uL (ref 150–400)
RBC: 5.11 MIL/uL (ref 3.87–5.11)
RDW: 12.8 % (ref 11.5–15.5)
WBC: 5.7 10*3/uL (ref 4.0–10.5)
nRBC: 0 % (ref 0.0–0.2)

## 2023-01-06 LAB — COMPREHENSIVE METABOLIC PANEL
ALT: 23 U/L (ref 0–44)
AST: 26 U/L (ref 15–41)
Albumin: 4 g/dL (ref 3.5–5.0)
Alkaline Phosphatase: 76 U/L (ref 38–126)
Anion gap: 10 (ref 5–15)
BUN: 15 mg/dL (ref 6–20)
CO2: 24 mmol/L (ref 22–32)
Calcium: 8.6 mg/dL — ABNORMAL LOW (ref 8.9–10.3)
Chloride: 103 mmol/L (ref 98–111)
Creatinine, Ser: 0.81 mg/dL (ref 0.44–1.00)
GFR, Estimated: >60 mL/min (ref >60)
Glucose, Bld: 104 mg/dL — ABNORMAL HIGH (ref 70–99)
Potassium: 4 mmol/L (ref 3.5–5.1)
Sodium: 137 mmol/L (ref 135–145)
Total Bilirubin: 0.6 mg/dL (ref 0.3–1.2)
Total Protein: 6.8 g/dL (ref 6.5–8.1)

## 2023-01-06 LAB — LIPASE, BLOOD: Lipase: 26 U/L (ref 11–51)

## 2023-01-06 LAB — CK: Total CK: 83 U/L (ref 38–234)

## 2023-01-06 MED ORDER — PROCHLORPERAZINE EDISYLATE 10 MG/2ML IJ SOLN
10.0000 mg | Freq: Once | INTRAMUSCULAR | Status: AC
  Administered 2023-01-06: 10 mg via INTRAVENOUS
  Filled 2023-01-06: qty 2

## 2023-01-06 MED ORDER — ONDANSETRON HCL 4 MG PO TABS: 4.0000 mg | ORAL_TABLET | Freq: Three times a day (TID) | ORAL | 0 refills | Status: AC | PRN

## 2023-01-06 MED ORDER — LACTATED RINGERS IV BOLUS
1000.0000 mL | Freq: Once | INTRAVENOUS | Status: AC
  Administered 2023-01-06: 1000 mL via INTRAVENOUS

## 2023-01-06 NOTE — ED Notes (Signed)
Pt was given Ginger ale to drink for PO challenge.

## 2023-01-06 NOTE — ED Provider Notes (Signed)
Patient care was taken over from Dr. Bernette Mayers.  Patient presented with nausea vomiting and diarrhea.  She was given antiemetics and IV fluids.  On reassessment, she feels remarkably better.  She has a benign abdomen with no abdominal tenderness on exam.  Her labs reviewed and are nonconcerning.  No clinical need for imaging at this time.  She tolerated an oral trial of ginger ale.  She feels like she is ready to go home.  She was discharged home in good condition.  She was given a prescription for Zofran as she had run out of her Zofran at home.  Return precautions were given.   Rolan Bucco, MD 01/06/23 [phone]

## 2023-01-06 NOTE — ED Notes (Signed)
Patient reports improvement in symptoms at this time.

## 2023-01-06 NOTE — ED Triage Notes (Signed)
Pt with acute abd pain, nausea, diarrhea, and vomiting since 4:30pm yesterday. Pt started Crestor on Sunday, unsure if this is the cause. Has not eaten anything new or different. No fevers. Hf of cardiac stent and HTN. Took zofran with no relief.

## 2023-01-06 NOTE — ED Provider Notes (Signed)
EMERGENCY DEPARTMENT AT [facility]  Provider Note  CSN: [id number] Arrival date & time: 01/06/23 4825  History Chief Complaint  Patient presents with   Emesis    Theresa Bass is a 57 y.o. female reports she began to have some vague abdominal discomfort about 24 hours ago, worsened in the afternoon. Has had multiple episode of nausea, vomiting, diarrhea and epigastric/LUQ abdominal pain since around 1630hrs yesterday. Denies fever, no blood. She has a history of CAD with a stent in 2022, has been on Jardiance since then but A1c has been in 'prediabetes range'. She was also recently started back on statin (Crestor) after having some joint aches on a different one previously. Tried Zofran at home x 2 without resolution. No urinary symptoms.    Home Medications Prior to Admission medications   Medication Sig Start Date End Date Taking? Authorizing Provider  Ascorbic Acid (VITAMIN C PO) Take by mouth.    [provider]  aspirin EC 81 MG tablet Take 81 mg by mouth daily. Swallow whole.    [provider]  Cholecalciferol (VITAMIN D-3) 125 MCG (5000 UT) TABS Take 5,000 Units by mouth at bedtime.    [provider]  JARDIANCE 10 MG TABS tablet Take 10 mg by mouth daily. 07/08/22   [provider]  losartan (COZAAR) 50 MG tablet Take 1 tablet (50 mg total) by mouth daily. 11/03/22   Copland, Gwenlyn Found, MD  metoprolol succinate (TOPROL-XL) 25 MG 24 hr tablet Take 1 tablet (25 mg total) by mouth daily. 08/11/22   Tolia, Sunit, DO  Misc Natural Products (CORTISOL MANAGER PO) Take by mouth.    [provider]  Multiple Vitamins-Minerals (ZINC PO) Take by mouth.    [provider]  Prasterone, DHEA, (DHEA PO) Take by mouth.    [provider]  rosuvastatin (CRESTOR) 10 MG tablet Take 1 tablet (10 mg total) by mouth daily. Increase to 20 mg after 1-2 weeks if tolerated 01/01/23   Copland, Gwenlyn Found, MD  UNABLE TO FIND  Med Name: Berberine 450    [provider]     Allergies    Patient has no known allergies.   Review of Systems   Review of Systems Please see HPI for pertinent positives and negatives  Physical Exam BP 124/65 (BP Location: Left Arm)   Pulse 87   Temp 98.3 F (36.8 C) (Oral)   Resp 18   Ht 5' (1.524 m)   Wt 65.3 kg   LMP 12/22/2020 (Approximate) Comment: states she takes HRT  SpO2 99%   BMI 28.12 kg/m   Physical Exam Vitals and nursing note reviewed.  Constitutional:      Appearance: Normal appearance.  HENT:     Head: Normocephalic and atraumatic.     Nose: Nose normal.     Mouth/Throat:     Mouth: Mucous membranes are moist.  Eyes:     Extraocular Movements: Extraocular movements intact.     Conjunctiva/sclera: Conjunctivae normal.  Cardiovascular:     Rate and Rhythm: Normal rate.  Pulmonary:     Effort: Pulmonary effort is normal.     Breath sounds: Normal breath sounds.  Abdominal:     General: Abdomen is flat.     Palpations: Abdomen is soft.     Tenderness: There is abdominal tenderness (mild) in the epigastric area and left upper quadrant. There is no guarding. Negative signs include Murphy's sign and McBurney's sign.  Musculoskeletal:  General: No swelling. Normal range of motion.     Cervical back: Neck supple.  Skin:    General: Skin is warm and dry.  Neurological:     General: No focal deficit present.     Mental Status: She is alert.  Psychiatric:        Mood and Affect: Mood normal.     ED Results / Procedures / Treatments   EKG EKG Interpretation  Date/Time:  Tuesday January 06 2023 06:09:00 EDT Ventricular Rate:  75 PR Interval:  162 QRS Duration: 84 QT Interval:  374 QTC Calculation: 418 R Axis:   26 Text Interpretation: Sinus rhythm Low voltage, precordial leads Anteroseptal infarct, old No significant change since last tracing Confirmed by Susy Frizzle [phone]) on 01/06/2023 6:10:46  AM  Procedures Procedures  Medications Ordered in the ED Medications  prochlorperazine (COMPAZINE) injection 10 mg (10 mg Intravenous Given 01/06/23 0625)  lactated ringers bolus 1,000 mL (1,000 mLs Intravenous New Bag/Given 01/06/23 6301)    Initial Impression and Plan  Patient here with N/V/D, not improved with Zofran at home x 2. Abdomen is benign, exam and vitals are reassuring. Likely a gastroenteritis, will check labs, give IVF and antiemetics.   ED Course   Clinical Course as of 01/06/23 0706  Tue Jan 06, 2023  0630 CBC is normal.  [CS]  [phone] Care of the patient signed out to Dr. Fredderick Phenix at the change of shift.  [CS]    Clinical Course User Index [CS] Pollyann Savoy, MD     MDM Rules/Calculators/A&P Medical Decision Making Problems Addressed: Nausea vomiting and diarrhea: acute illness or injury  Amount and/or Complexity of Data Reviewed Labs: ordered. Decision-making details documented in ED Course.  Risk Prescription drug management.     Final Clinical Impression(s) / ED Diagnoses Final diagnoses:  Nausea vomiting and diarrhea    Rx / DC Orders ED Discharge Orders     None        Pollyann Savoy, MD 01/06/23 [phone]

## 2023-01-21 ENCOUNTER — Ambulatory Visit
Admission: RE | Admit: 2023-01-21 | Discharge: 2023-01-21 | Disposition: A | Discharge status: Home / Self Care | Payer: BC Managed Care – PPO | Source: Ambulatory Visit | Attending: Family Medicine | Admitting: Family Medicine

## 2023-01-21 ENCOUNTER — Other Ambulatory Visit: Payer: Self-pay | Admitting: Family Medicine

## 2023-01-21 DIAGNOSIS — R928 Other abnormal and inconclusive findings on diagnostic imaging of breast: Secondary | ICD-10-CM

## 2023-01-21 DIAGNOSIS — R92322 Mammographic fibroglandular density, left breast: Secondary | ICD-10-CM | POA: Diagnosis not present

## 2023-01-21 DIAGNOSIS — N6313 Unspecified lump in the right breast, lower outer quadrant: Secondary | ICD-10-CM | POA: Diagnosis not present

## 2023-01-21 DIAGNOSIS — N6311 Unspecified lump in the right breast, upper outer quadrant: Secondary | ICD-10-CM | POA: Diagnosis not present

## 2023-01-29 ENCOUNTER — Other Ambulatory Visit: Payer: Self-pay | Admitting: Cardiology

## 2023-01-29 DIAGNOSIS — I209 Angina pectoris, unspecified: Secondary | ICD-10-CM

## 2023-02-03 ENCOUNTER — Other Ambulatory Visit: Payer: Self-pay | Admitting: Family Medicine

## 2023-02-03 DIAGNOSIS — I10 Essential (primary) hypertension: Secondary | ICD-10-CM

## 2023-02-12 DIAGNOSIS — R6882 Decreased libido: Secondary | ICD-10-CM | POA: Diagnosis not present

## 2023-02-12 DIAGNOSIS — Z95818 Presence of other cardiac implants and grafts: Secondary | ICD-10-CM | POA: Diagnosis not present

## 2023-02-12 DIAGNOSIS — N9419 Other specified dyspareunia: Secondary | ICD-10-CM | POA: Diagnosis not present

## 2023-02-12 DIAGNOSIS — N951 Menopausal and female climacteric states: Secondary | ICD-10-CM | POA: Diagnosis not present

## 2023-02-16 ENCOUNTER — Encounter: Payer: Self-pay | Admitting: Family Medicine

## 2023-02-16 DIAGNOSIS — I2510 Atherosclerotic heart disease of native coronary artery without angina pectoris: Secondary | ICD-10-CM

## 2023-02-16 MED ORDER — ROSUVASTATIN CALCIUM 20 MG PO TABS: 20.0000 mg | ORAL_TABLET | Freq: Every day | ORAL | 3 refills | Status: DC

## 2023-02-16 NOTE — Telephone Encounter (Signed)
Was pt instructed to increase to 20 mg?

## 2023-04-03 ENCOUNTER — Other Ambulatory Visit: Payer: Self-pay | Admitting: Family Medicine

## 2023-04-23 ENCOUNTER — Other Ambulatory Visit (HOSPITAL_BASED_OUTPATIENT_CLINIC_OR_DEPARTMENT_OTHER): Payer: Self-pay

## 2023-07-31 ENCOUNTER — Other Ambulatory Visit: Payer: Self-pay | Admitting: Cardiology

## 2023-07-31 DIAGNOSIS — I209 Angina pectoris, unspecified: Secondary | ICD-10-CM

## 2023-08-10 ENCOUNTER — Encounter: Payer: Self-pay | Admitting: Family Medicine

## 2023-08-10 DIAGNOSIS — I10 Essential (primary) hypertension: Secondary | ICD-10-CM

## 2023-08-10 MED ORDER — LOSARTAN POTASSIUM 50 MG PO TABS
50.0000 mg | ORAL_TABLET | Freq: Every day | ORAL | 1 refills | Status: DC
Start: 2023-08-10 — End: 2024-02-03

## 2023-08-11 IMAGING — MG DIGITAL DIAGNOSTIC BILAT W/ TOMO W/ CAD
8 series · 8 of 24 positions shown · non-contrast
Comparison: Previous exam(s).

CLINICAL DATA: 55-year-old female for 1 year follow-up of OUTER
RIGHT breast asymmetry and for annual bilateral mammogram.

EXAM:
DIGITAL DIAGNOSTIC BILATERAL MAMMOGRAM WITH TOMOSYNTHESIS AND CAD
TECHNIQUE: Bilateral digital diagnostic mammography and breast tomosynthesis
was performed. The images were evaluated with computer-aided
detection.

[R MLO synth-2D]
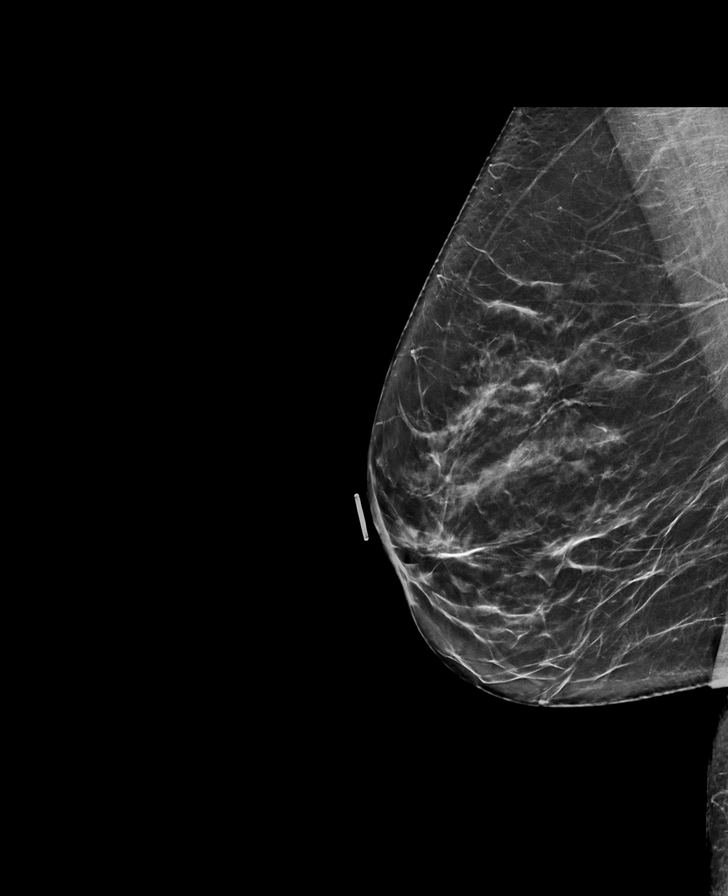

[R CC synth-2D]
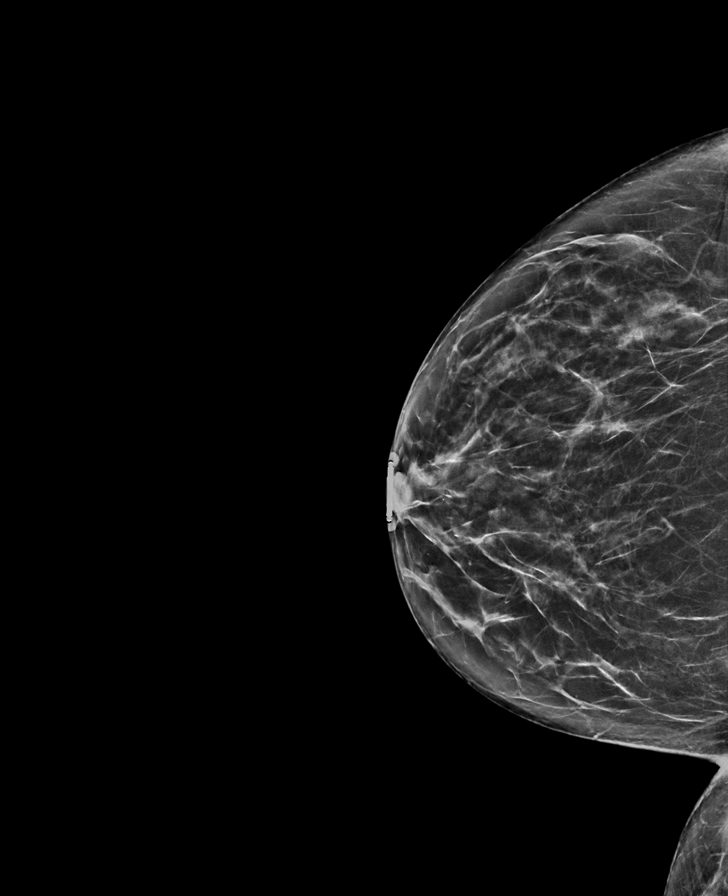

[L CC synth-2D]
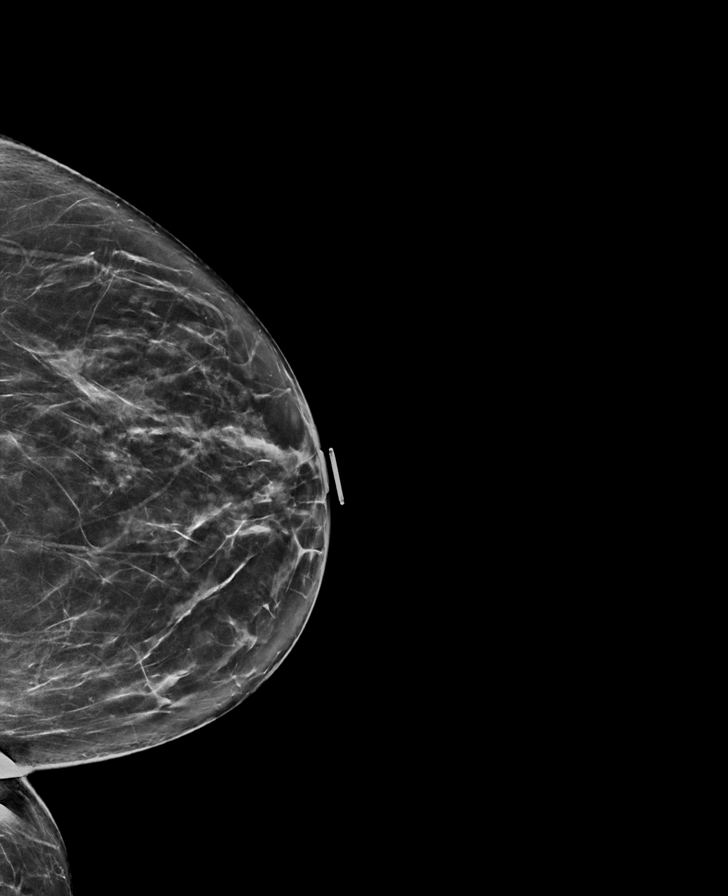

[L MLO synth-2D]
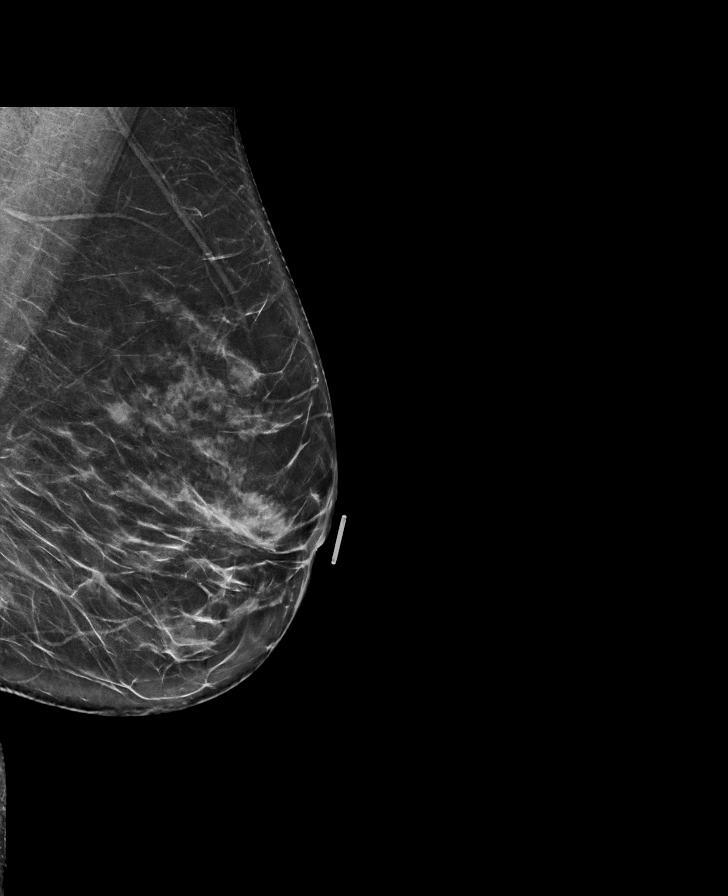

[L MLO tomo · tomo slice 39/78.0]
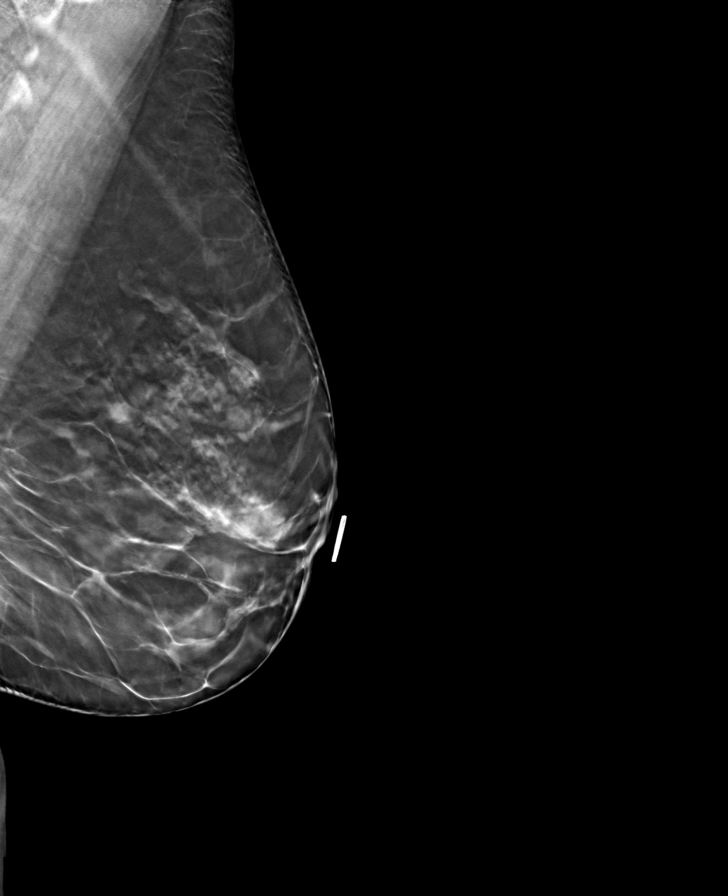

[L CC tomo · tomo slice 35/68.0]
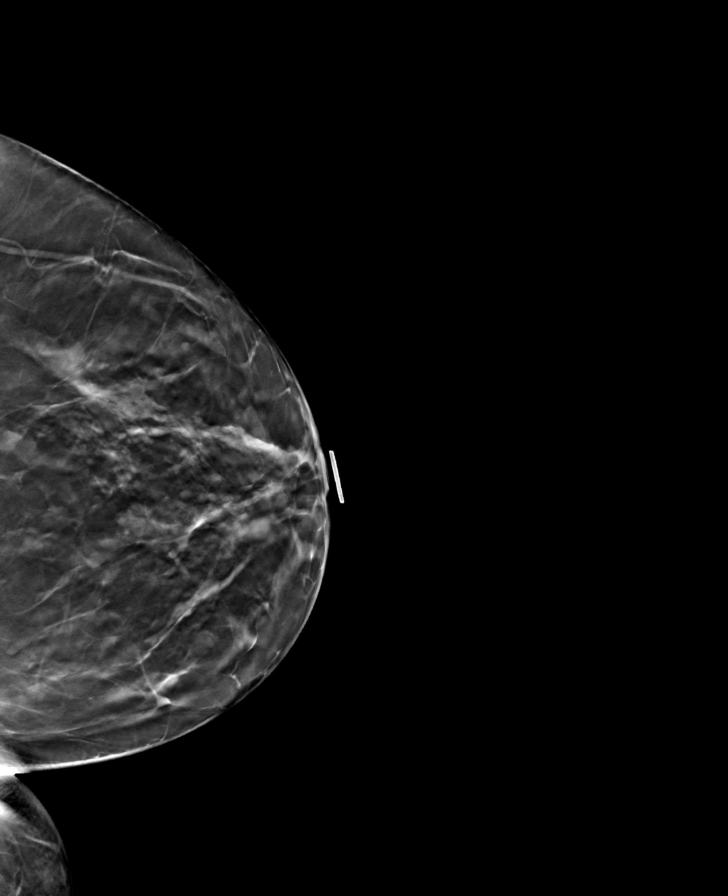

[R CC tomo · tomo slice 35/68.0]
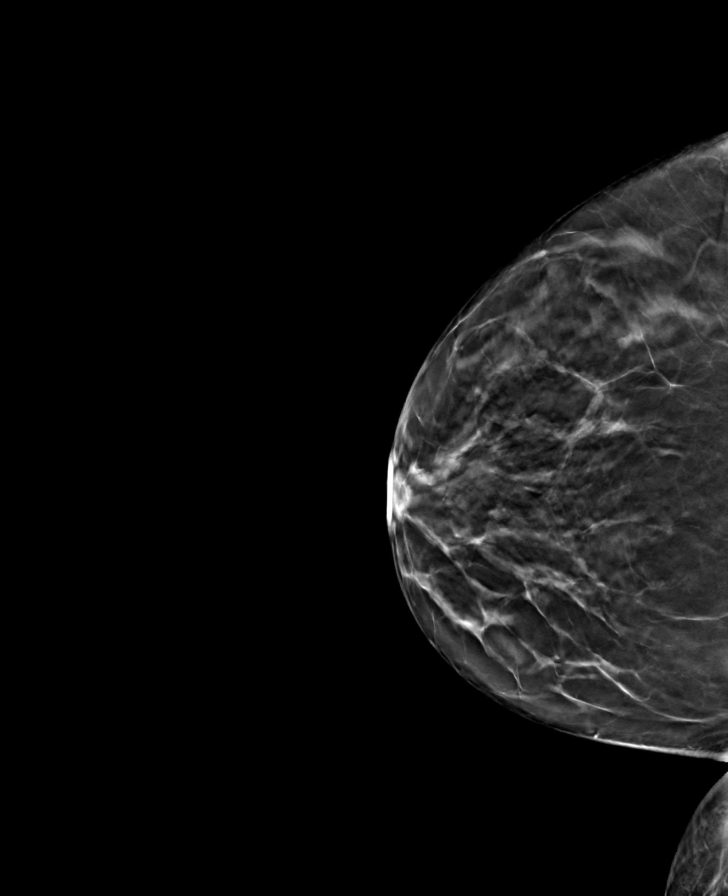

[R MLO tomo · tomo slice 37/74.0]
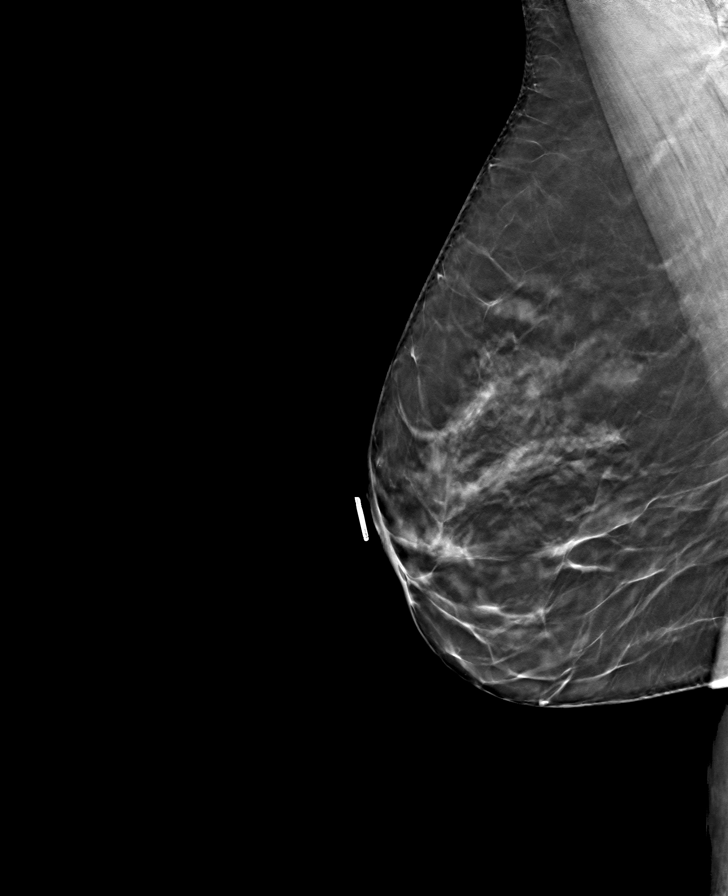

[8 of 24 positions shown; findings below may reference images not displayed]

ACR Breast Density Category c: The breast tissue is heterogeneously
dense, which may obscure small masses.
FINDINGS: 2D/3D full field views of both breasts demonstrate a stable focal
asymmetry within the posterior OUTER RIGHT breast.

No new or suspicious mammographic findings are noted.

A UPPER-OUTER LEFT breast mass previously documented as a benign
cyst is unchanged.
IMPRESSION: 1. Stable OUTER RIGHT breast asymmetry. One year follow-up
recommended to ensure 2 year stability.
2. No new or suspicious mammographic findings within either breast.

RECOMMENDATION:
Bilateral diagnostic mammogram in 1 year.

I have discussed the findings and recommendations with the patient.
If applicable, a reminder letter will be sent to the patient
regarding the next appointment.

BI-RADS CATEGORY  3: Probably benign.

## 2023-09-28 ENCOUNTER — Other Ambulatory Visit: Payer: Self-pay | Admitting: Family Medicine

## 2023-12-25 ENCOUNTER — Other Ambulatory Visit: Payer: Self-pay | Admitting: Family Medicine

## 2023-12-30 NOTE — Progress Notes (Signed)
 [facility] [address], [address] [place], [place] [address] [phone]  Date:  01/04/2024   Name:  Theresa Bass   DOB:  07-Nov-1965   MRN:  [id number]  PCP:  Pearline Cables, MD    Chief Complaint: No chief complaint on file.   History of Present Illness:  Theresa Bass is a 58 y.o. very pleasant female patient who presents with the following:  Patient seen today for physical exam, most recent visit with myself was April of last year History of hypertension, migraine headache, unstable angina and CAD-she had a PCI to the LAD in April 2022.  Her health has been fine- she is still getting plenty of exercise including yoga No CP with activity  She will see her cardiologist coming up in a couple of weeks  Patient has she stopped her hormone replacement therapy at the time of her PCI.  She is not having hot flashes, but she is really bothered by vaginal discomfort and dryness.  Her husband had a left BKA a few days ago- he is at the hospital now and will go to rehab soon.  We both hope the worst is over now   Colon cancer screening-it looks like Cologuard may be due for update.  Negative Cologuard 2021 Mammogram-about 1 year ago Pap smear-seen by gynecology, Dr. Jamelle Haring last May Lab work one year ago, can update   Married to [place], they have 2 sons. - they are doing well  She is UTD on her flu and covid, shingrix done  Aspirin 81 Jardiance 10 Losartan 50 Toprol-XL 25 Crestor 20 Patient Active Problem List   Diagnosis Date Noted   Unstable angina (HCC) 01/05/2021   Migraine     Past Medical History:  Diagnosis Date   Coronary artery disease    Hyperlipidemia    Hypertension    Migraine     Past Surgical History:  Procedure Laterality Date   CESAREAN SECTION     x 4   CORONARY STENT INTERVENTION N/A 01/07/2021   Procedure: CORONARY STENT INTERVENTION;  Surgeon: Elder Negus, MD;  Location: [facility]  INVASIVE CV LAB;  Service: Cardiovascular;  Laterality: N/A;   CORONARY ULTRASOUND/IVUS N/A 01/07/2021   Procedure: Intravascular Ultrasound/IVUS;  Surgeon: Elder Negus, MD;  Location: [facility] INVASIVE CV LAB;  Service: Cardiovascular;  Laterality: N/A;   ELBOW SURGERY     HYSTEROSCOPY     LEFT HEART CATH AND CORONARY ANGIOGRAPHY N/A 01/07/2021   Procedure: LEFT HEART CATH AND CORONARY ANGIOGRAPHY;  Surgeon: Elder Negus, MD;  Location: [facility] INVASIVE CV LAB;  Service: Cardiovascular;  Laterality: N/A;   PELVIC LAPAROSCOPY     TUBAL LIGATION      Social History   Tobacco Use   Smoking status: Never   Smokeless tobacco: Never  Vaping Use   Vaping status: Never Used  Substance Use Topics   Alcohol use: Not Currently    Alcohol/week: 0.0 standard drinks of alcohol    Comment: Rare   Drug use: No    Family History  Problem Relation Age of Onset   Hypertension Mother    Heart disease Mother    Hypertension Father    Heart failure Father    Hypertension Brother    Hypertension Sister     No Known Allergies  Medication list has been reviewed and updated.  Current Outpatient Medications on File Prior to Visit  Medication Sig Dispense  Refill   Ascorbic Acid (VITAMIN C PO) Take by mouth.     aspirin EC 81 MG tablet Take 81 mg by mouth daily. Swallow whole.     Cholecalciferol (VITAMIN D-3) 125 MCG (5000 UT) TABS Take 5,000 Units by mouth at bedtime.     empagliflozin (JARDIANCE) 10 MG TABS tablet Take 1 tablet (10 mg total) by mouth daily. 90 tablet 0   losartan (COZAAR) 50 MG tablet Take 1 tablet (50 mg total) by mouth daily. 90 tablet 1   metoprolol succinate (TOPROL-XL) 25 MG 24 hr tablet TAKE 1 TABLET(25 MG) BY MOUTH DAILY 90 tablet 3   Misc Natural Products (CORTISOL MANAGER PO) Take by mouth.     Multiple Vitamins-Minerals (ZINC PO) Take by mouth.     ondansetron (ZOFRAN) 4 MG tablet Take 1 tablet (4 mg total) by mouth every 8 (eight) hours as needed for nausea or  vomiting. 4 tablet 0   Prasterone, DHEA, (DHEA PO) Take by mouth.     UNABLE TO FIND Med Name: Berberine 450     No current facility-administered medications on file prior to visit.    Review of Systems:  As per HPI- otherwise negative.   Physical Examination: Vitals:   01/04/24 0832  BP: 120/73  Pulse: 74  Resp: 16  Temp: 98.7 F (37.1 C)  SpO2: 98%   Vitals:   01/04/24 0832  Weight: 121 lb (54.9 kg)  Height: 5' 0.5" (1.537 m)   Body mass index is 23.24 kg/m. Ideal Body Weight: Weight in (lb) to have BMI = 25: 129.9  GEN: no acute distress. Looks well, normal weight HEENT: Atraumatic, Normocephalic.  Earwax is present bilaterally, recommended over-the-counter wax remover drops.  Otherwise ENT exam is normal Ears and Nose: No external deformity. CV: RRR, No M/G/R. No JVD. No thrill. No extra heart sounds. PULM: CTA B, no wheezes, crackles, rhonchi. No retractions. No resp. distress. No accessory muscle use. ABD: S, NT, ND. No rebound. No HSM. EXTR: No c/c/e PSYCH: Normally interactive. Conversant.    Assessment and Plan: Physical exam  Essential hypertension - Plan: CBC, Comprehensive metabolic panel with GFR  Coronary artery disease involving native heart without angina pectoris, unspecified vessel or lesion type - Plan: Lipid panel, rosuvastatin (CRESTOR) 20 MG tablet  Screening for deficiency anemia - Plan: CBC  Screening for thyroid disorder - Plan: TSH  Pre-diabetes - Plan: Hemoglobin A1c  Vitamin D deficiency - Plan: VITAMIN D 25 Hydroxy (Vit-D Deficiency, Fractures)  Screening for colon cancer - Plan: Cologuard  Immunization due - Plan: Pneumococcal conjugate vaccine 20-valent (Prevnar 20)  Physical exam today.  Encouraged healthy diet and exercise routine. Will plan further follow- up pending labs.  She will see cardiology soon for follow-up, I suggested that she ask her cardiologist about the possibility of using the vaginal estrogen cream.  She  came off her hormone replacement at the time of her heart surgery, but is really bothered by vaginal dryness.  I wonder if a topical cream would be acceptable.  If cardiology approves I can prescribe it for her.  There is no history of GYN cancer  Order Cologuard, gave pneumonia vaccine  Signed Abbe Amsterdam, MD  Received her labs as below, message to pt  Results for orders placed or performed in visit on 01/04/24  CBC   Collection Time: 01/04/24  8:58 AM  Result Value Ref Range   WBC 3.9 (L) 4.0 - 10.5 K/uL   RBC 4.81 3.87 - 5.11  Mil/uL   Platelets 238.0 150.0 - 400.0 K/uL   Hemoglobin 14.1 12.0 - 15.0 g/dL   HCT 81.1 91.4 - 78.2 %   MCV 88.4 78.0 - 100.0 fl   MCHC 33.1 30.0 - 36.0 g/dL   RDW 95.6 21.3 - 08.6 %  Comprehensive metabolic panel with GFR   Collection Time: 01/04/24  8:58 AM  Result Value Ref Range   Sodium 140 135 - 145 mEq/L   Potassium 4.4 3.5 - 5.1 mEq/L   Chloride 103 96 - 112 mEq/L   CO2 27 19 - 32 mEq/L   Glucose, Bld 98 70 - 99 mg/dL   BUN 17 6 - 23 mg/dL   Creatinine, Ser 5.78 0.40 - 1.20 mg/dL   Total Bilirubin 0.5 0.2 - 1.2 mg/dL   Alkaline Phosphatase 64 39 - 117 U/L   AST 26 0 - 37 U/L   ALT 27 0 - 35 U/L   Total Protein 6.8 6.0 - 8.3 g/dL   Albumin 4.6 3.5 - 5.2 g/dL   GFR 46.96 >29.52 mL/min   Calcium 9.6 8.4 - 10.5 mg/dL  Hemoglobin W4X   Collection Time: 01/04/24  8:58 AM  Result Value Ref Range   Hgb A1c MFr Bld 5.8 4.6 - 6.5 %  Lipid panel   Collection Time: 01/04/24  8:58 AM  Result Value Ref Range   Cholesterol 132 0 - 200 mg/dL   Triglycerides 32.4 0.0 - 149.0 mg/dL   HDL 40.10 >27.25 mg/dL   VLDL 36.6 0.0 - 44.0 mg/dL   LDL Cholesterol 42 0 - 99 mg/dL   Total CHOL/HDL Ratio 2    NonHDL 53.73   TSH   Collection Time: 01/04/24  8:58 AM  Result Value Ref Range   TSH 1.14 0.35 - 5.50 uIU/mL  VITAMIN D 25 Hydroxy (Vit-D Deficiency, Fractures)   Collection Time: 01/04/24  8:58 AM  Result Value Ref Range   VITD 47.22 30.00 -  100.00 ng/mL

## 2023-12-30 NOTE — Patient Instructions (Addendum)
 It was great to see you again today, I will be in touch with your labs. Recommend COVID booster if none the last 6 months or so- done!    We will get a cologuard kit sent to your home Please check with your cardiologist about the possibility of your using vaginal estrogen cream   Pneumonia vaccine today  Please see me in about 6 months assuming all is well I would be glad to see Virl Diamond at your convenience and see if we can address his weight

## 2024-01-04 ENCOUNTER — Ambulatory Visit (INDEPENDENT_AMBULATORY_CARE_PROVIDER_SITE_OTHER): Payer: BC Managed Care – PPO | Admitting: Family Medicine

## 2024-01-04 ENCOUNTER — Encounter: Payer: Self-pay | Admitting: Family Medicine

## 2024-01-04 VITALS — BP 120/73 | HR 74 | Temp 98.7°F | Resp 16 | Ht 60.5 in | Wt 121.0 lb

## 2024-01-04 DIAGNOSIS — Z130 Encounter for screening for diseases of the blood and blood-forming organs and certain disorders involving the immune mechanism: Secondary | ICD-10-CM | POA: Diagnosis not present

## 2024-01-04 DIAGNOSIS — Z0000 Encounter for general adult medical examination without abnormal findings: Secondary | ICD-10-CM | POA: Diagnosis not present

## 2024-01-04 DIAGNOSIS — I10 Essential (primary) hypertension: Secondary | ICD-10-CM | POA: Diagnosis not present

## 2024-01-04 DIAGNOSIS — R7303 Prediabetes: Secondary | ICD-10-CM

## 2024-01-04 DIAGNOSIS — E559 Vitamin D deficiency, unspecified: Secondary | ICD-10-CM

## 2024-01-04 DIAGNOSIS — Z1329 Encounter for screening for other suspected endocrine disorder: Secondary | ICD-10-CM | POA: Diagnosis not present

## 2024-01-04 DIAGNOSIS — I2510 Atherosclerotic heart disease of native coronary artery without angina pectoris: Secondary | ICD-10-CM | POA: Diagnosis not present

## 2024-01-04 DIAGNOSIS — Z23 Encounter for immunization: Secondary | ICD-10-CM

## 2024-01-04 DIAGNOSIS — Z1211 Encounter for screening for malignant neoplasm of colon: Secondary | ICD-10-CM

## 2024-01-04 LAB — LIPID PANEL
Cholesterol: 132 mg/dL (ref 0–200)
HDL: 78.4 mg/dL (ref >39.00)
LDL Cholesterol: 42 mg/dL (ref 0–99)
NonHDL: 53.73
Total CHOL/HDL Ratio: 2
Triglycerides: 60 mg/dL (ref 0.0–149.0)
VLDL: 12 mg/dL (ref 0.0–40.0)

## 2024-01-04 LAB — COMPREHENSIVE METABOLIC PANEL WITH GFR
ALT: 27 U/L (ref 0–35)
AST: 26 U/L (ref 0–37)
Albumin: 4.6 g/dL (ref 3.5–5.2)
Alkaline Phosphatase: 64 U/L (ref 39–117)
BUN: 17 mg/dL (ref 6–23)
CO2: 27 meq/L (ref 19–32)
Calcium: 9.6 mg/dL (ref 8.4–10.5)
Chloride: 103 meq/L (ref 96–112)
Creatinine, Ser: 0.75 mg/dL (ref 0.40–1.20)
GFR: 86.73 mL/min (ref >60.00)
Glucose, Bld: 98 mg/dL (ref 70–99)
Potassium: 4.4 meq/L (ref 3.5–5.1)
Sodium: 140 meq/L (ref 135–145)
Total Bilirubin: 0.5 mg/dL (ref 0.2–1.2)
Total Protein: 6.8 g/dL (ref 6.0–8.3)

## 2024-01-04 LAB — HEMOGLOBIN A1C: Hgb A1c MFr Bld: 5.8 % (ref 4.6–6.5)

## 2024-01-04 LAB — CBC
HCT: 42.5 % (ref 36.0–46.0)
Hemoglobin: 14.1 g/dL (ref 12.0–15.0)
MCHC: 33.1 g/dL (ref 30.0–36.0)
MCV: 88.4 fl (ref 78.0–100.0)
Platelets: 238 10*3/uL (ref 150.0–400.0)
RBC: 4.81 Mil/uL (ref 3.87–5.11)
RDW: 12.9 % (ref 11.5–15.5)
WBC: 3.9 10*3/uL — ABNORMAL LOW (ref 4.0–10.5)

## 2024-01-04 LAB — TSH: TSH: 1.14 u[IU]/mL (ref 0.35–5.50)

## 2024-01-04 LAB — VITAMIN D 25 HYDROXY (VIT D DEFICIENCY, FRACTURES): VITD: 47.22 ng/mL (ref 30.00–100.00)

## 2024-01-04 MED ORDER — ROSUVASTATIN CALCIUM 20 MG PO TABS: 20.0000 mg | ORAL_TABLET | Freq: Every day | ORAL | 3 refills | Status: DC

## 2024-01-14 ENCOUNTER — Ambulatory Visit: Payer: Self-pay | Admitting: Cardiology

## 2024-01-15 ENCOUNTER — Ambulatory Visit: Payer: BC Managed Care – PPO | Attending: Cardiology | Admitting: Cardiology

## 2024-01-15 VITALS — BP 128/88 | HR 89 | Resp 16 | Ht 60.0 in | Wt 120.0 lb

## 2024-01-15 DIAGNOSIS — I10 Essential (primary) hypertension: Secondary | ICD-10-CM | POA: Diagnosis not present

## 2024-01-15 DIAGNOSIS — Z955 Presence of coronary angioplasty implant and graft: Secondary | ICD-10-CM

## 2024-01-15 DIAGNOSIS — E782 Mixed hyperlipidemia: Secondary | ICD-10-CM

## 2024-01-15 DIAGNOSIS — I2510 Atherosclerotic heart disease of native coronary artery without angina pectoris: Secondary | ICD-10-CM

## 2024-01-15 DIAGNOSIS — Z8249 Family history of ischemic heart disease and other diseases of the circulatory system: Secondary | ICD-10-CM

## 2024-01-15 NOTE — Progress Notes (Unsigned)
 Cardiology Office Note:  .   ID:  Theresa Bass, DOB [birth year], MRN [id number] PCP:  Kaylee Partridge, MD  Former Cardiology Providers: NA [facility] Providers Cardiologist:  Olinda Bertrand, DO , [facility] (established care 12/2020) Electrophysiologist:  None  Click to update primary MD,subspecialty MD or APP then REFRESH:1}    Chief Complaint  Patient presents with   Atherosclerosis of native coronary artery of native heart w   Follow-up    History of Present Illness: .   Theresa Bass is a 58 y.o. Caucasian female whose past medical history and cardiovascular risk factors includes: Premature CAD status post PCI to the LAD (April 2022), postmenopausal female, hypertension, on hormone replacement therapy, family history of premature coronary artery disease.   Patient being followed with the practice given her history of coronary artery disease.  In April 2022 she presented to the office with symptoms of unstable angina.  Underwent elective heart catheterization/angiography and was noted to have disease in the LAD and underwent angioplasty and stent.  Since then she has been focusing on improving her modifiable cardiovascular risk factors.  She presents today for greater than 1 year follow-up visit.  Last seen in the office in December 2023.  Since last office visit patient denies any anginal chest pain or heart failure symptoms.  No hospitalizations or urgent care visits for cardiovascular reasons.  She has been compliant with her medical therapy.  No significant weight gain.  Physical endurance remains stable, she enjoys walking at least 6 to 7 miles per day and does hot yoga.  Patient is considering low-dose hormone replacement therapy i.e. estrogen cream) given her hot flashes under the care of her gynecologist.   Review of Systems: .   Review of Systems  Cardiovascular:  Negative for chest pain, claudication, irregular heartbeat, leg swelling, near-syncope, orthopnea,  palpitations, paroxysmal nocturnal dyspnea and syncope.  Respiratory:  Negative for shortness of breath.   Hematologic/Lymphatic: Negative for bleeding problem.    Studies Reviewed:   EKG: EKG Interpretation Date/Time:  Friday January 15 2024 09:56:43 EDT Ventricular Rate:  64 PR Interval:  174 QRS Duration:  64 QT Interval:  386 QTC Calculation: 398 R Axis:   -19  Text Interpretation: Normal sinus rhythm Low voltage QRS Septal infarct , age undetermined When compared with ECG of 06-Jan-2023 06:09, No significant change since last tracing Confirmed by Olinda Bertrand [phone]) on 01/15/2024 10:03:30 AM  Echocardiogram: 01/06/2021: LVEF 50-55%, with regional wall motion abnormalities, grade 1 diastolic impairment, trivial MR.  Stress Testing: Heart Catheterization: 01/07/2021:  LM: Normal LAD: Mid 95% stenosis LCx: Normal RCA: Normal   Successful IVUS guided percutaneous coronary intervention mid LAD PTCA and stent placement 3.5 X 20 mm Synergy drug-eluting stent 0% residual stenosis  RADIOLOGY: NA  Risk Assessment/Calculations:   The 10-year ASCVD risk score (Arnett DK, et al., 2019) is: 2.5%   Values used to calculate the score:     Age: 58 years     Sex: Female     Is Non-Hispanic African American: No     Diabetic: No     Tobacco smoker: No     Systolic Blood Pressure: 128 mmHg     Is BP treated: Yes     HDL Cholesterol: 78.4 mg/dL     Total Cholesterol: 132 mg/dL  Labs:       Latest Ref Rng & Units 01/04/2024    8:58 AM 01/06/2023    6:20 AM 10/21/2022   12:00 AM  CBC  WBC 4.0 - 10.5 K/uL 3.9  5.7  4.7   Hemoglobin 12.0 - 15.0 g/dL 78.2  95.6  21.3   Hematocrit 36.0 - 46.0 % 42.5  44.7  44   Platelets 150.0 - 400.0 K/uL 238.0  217  265        Latest Ref Rng & Units 01/04/2024    8:58 AM 01/06/2023    7:04 AM 10/21/2022   12:00 AM  BMP  Glucose 70 - 99 mg/dL 98  086    BUN 6 - 23 mg/dL 17  15  12    Creatinine 0.40 - 1.20 mg/dL 5.78  4.69  0.9   Sodium 135 - 145  mEq/L 140  137  138   Potassium 3.5 - 5.1 mEq/L 4.4  4.0  4.1   Chloride 96 - 112 mEq/L 103  103  103   CO2 19 - 32 mEq/L 27  24  32   Calcium  8.4 - 10.5 mg/dL 9.6  8.6  9.6       Latest Ref Rng & Units 01/04/2024    8:58 AM 01/06/2023    7:04 AM 10/21/2022   12:00 AM  CMP  Glucose 70 - 99 mg/dL 98  629    BUN 6 - 23 mg/dL 17  15  12    Creatinine 0.40 - 1.20 mg/dL 5.28  4.13  0.9   Sodium 135 - 145 mEq/L 140  137  138   Potassium 3.5 - 5.1 mEq/L 4.4  4.0  4.1   Chloride 96 - 112 mEq/L 103  103  103   CO2 19 - 32 mEq/L 27  24  32   Calcium  8.4 - 10.5 mg/dL 9.6  8.6  9.6   Total Protein 6.0 - 8.3 g/dL 6.8  6.8    Total Bilirubin 0.2 - 1.2 mg/dL 0.5  0.6    Alkaline Phos 39 - 117 U/L 64  76    AST 0 - 37 U/L 26  26  29    ALT 0 - 35 U/L 27  23  34     Lab Results  Component Value Date   CHOL 132 01/04/2024   HDL 78.40 01/04/2024   LDLCALC 42 01/04/2024   LDLDIRECT 105 (H) 01/03/2021   TRIG 60.0 01/04/2024   CHOLHDL 2 01/04/2024   No results for input(s): "LIPOA" in the last 8760 hours. No components found for: "NTPROBNP" No results for input(s): "PROBNP" in the last 8760 hours. Recent Labs    01/04/24 0858  TSH 1.14    Physical Exam:    Today's Vitals   01/15/24 0956  BP: 128/88  Pulse: 89  Resp: 16  SpO2: 97%  Weight: 120 lb (54.4 kg)  Height: 5' (1.524 m)   Body mass index is 23.44 kg/m. Wt Readings from Last 3 Encounters:  01/15/24 120 lb (54.4 kg)  01/04/24 121 lb (54.9 kg)  01/06/23 143 lb 15.4 oz (65.3 kg)    Physical Exam  Constitutional: No distress.  hemodynamically stable  Neck: No JVD present.  Cardiovascular: Normal rate, regular rhythm, S1 normal and S2 normal. Exam reveals no gallop, no S3 and no S4.  No murmur heard. Pulmonary/Chest: Effort normal and breath sounds normal. No stridor. She has no wheezes. She has no rales.  Musculoskeletal:        General: No edema.     Cervical back: Neck supple.  Skin: Skin is warm.   Impression &  Recommendation(s):  Impression:  ICD-10-CM   1. Atherosclerosis of native coronary artery of native heart without angina pectoris  I25.10 EKG 12-Lead    ECHOCARDIOGRAM COMPLETE    2. History of coronary angioplasty with insertion of stent  Z95.5     3. Benign hypertension  I10 ECHOCARDIOGRAM COMPLETE    4. Mixed hyperlipidemia  E78.2     5. Family history of premature CAD  Z82.49        Recommendation(s):  Atherosclerosis of native coronary artery of native heart without angina pectoris History of coronary angioplasty with insertion of stent Denies anginal chest pain. EKG is nonischemic. Continue aspirin  and statin therapy. Continue losartan , Jardiance , Toprol -XL LDL is currently at goal at 42 mg/dL Excellent functional capacity for age Echocardiogram prior to the next office visit  Benign hypertension Home and office blood pressures are well-controlled. Continue losartan  50 mg p.o. daily. Continue Toprol -XL 25 mg p.o. daily. Continue Jardiance  10 mg p.o. daily  Mixed hyperlipidemia Continue Crestor  20 mg p.o. nightly Most recent lipid profile from April 2025) reviewed. Denies myalgias   Orders Placed:  Orders Placed This Encounter  Procedures   EKG 12-Lead   ECHOCARDIOGRAM COMPLETE    Standing Status:   Future    Expected Date:   12/28/2024    Expiration Date:   01/14/2025    Where should this test be performed:   [facility])    Does the patient weigh less than or greater than 250 lbs?:   Patient weighs less than 250 lbs    Perflutren DEFINITY (image enhancing agent) should be administered unless hypersensitivity or allergy exist:   Administer Perflutren    Reason for exam-Echo:   Other-Full Diagnosis List    Full ICD-10/Reason for Exam:   HTN (hypertension) [161096]    Full ICD-10/Reason for Exam:   Atherosclerosis of native coronary artery [0454098]     Final Medication List:   No orders of the defined types were placed in this  encounter.   Medications Discontinued During This Encounter  Medication Reason   Misc Natural Products (CORTISOL MANAGER PO) Patient Preference   Prasterone, DHEA, (DHEA PO) Patient Preference   UNABLE TO FIND Patient Preference     Current Outpatient Medications:    Ascorbic Acid (VITAMIN C PO), Take by mouth., Disp: , Rfl:    aspirin  EC 81 MG tablet, Take 81 mg by mouth daily. Swallow whole., Disp: , Rfl:    Cholecalciferol (VITAMIN D -3) 125 MCG (5000 UT) TABS, Take 5,000 Units by mouth at bedtime., Disp: , Rfl:    empagliflozin  (JARDIANCE ) 10 MG TABS tablet, Take 1 tablet (10 mg total) by mouth daily., Disp: 90 tablet, Rfl: 0   losartan  (COZAAR ) 50 MG tablet, Take 1 tablet (50 mg total) by mouth daily., Disp: 90 tablet, Rfl: 1   metoprolol  succinate (TOPROL -XL) 25 MG 24 hr tablet, TAKE 1 TABLET(25 MG) BY MOUTH DAILY, Disp: 90 tablet, Rfl: 3   Multiple Vitamins-Minerals (ZINC  PO), Take by mouth., Disp: , Rfl:    ondansetron  (ZOFRAN ) 4 MG tablet, Take 1 tablet (4 mg total) by mouth every 8 (eight) hours as needed for nausea or vomiting., Disp: 4 tablet, Rfl: 0   rosuvastatin  (CRESTOR ) 20 MG tablet, Take 1 tablet (20 mg total) by mouth daily. Increase to 20 mg after 1-2 weeks if tolerated, Disp: 90 tablet, Rfl: 3  Consent:   NA  Disposition:   1 year follow-up sooner if needed  Her questions and concerns were addressed to her satisfaction.  She voices understanding of the recommendations provided during this encounter.    Signed, Olinda Bertrand, DO, [facility]  [facility]  [address] [place], [place] [address]

## 2024-01-15 NOTE — Patient Instructions (Signed)
 Medication Instructions:  Your physician recommends that you continue on your current medications as directed. Please refer to the Current Medication list given to you today.  *If you need a refill on your cardiac medications before your next appointment, please call your pharmacy*  Lab Work: None ordered today. If you have labs (blood work) drawn today and your tests are completely normal, you will receive your results only by: MyChart Message (if you have MyChart) OR A paper copy in the mail If you have any lab test that is abnormal or we need to change your treatment, we will call you to review the results.  Testing/Procedures: Your physician has requested that you have an echocardiogram prior to 1 year follow-up with Dr. Albert Huff. Echocardiography is a painless test that uses sound waves to create images of your heart. It provides your doctor with information about the size and shape of your heart and how well your heart's chambers and valves are working. This procedure takes approximately one hour. There are no restrictions for this procedure. Please do NOT wear cologne, perfume, aftershave, or lotions (deodorant is allowed). Please arrive 15 minutes prior to your appointment time.  Please note: We ask at that you not bring children with you during ultrasound (echo/ vascular) testing. Due to room size and safety concerns, children are not allowed in the ultrasound rooms during exams. Our front office staff cannot provide observation of children in our lobby area while testing is being conducted. An adult accompanying a patient to their appointment will only be allowed in the ultrasound room at the discretion of the ultrasound technician under special circumstances. We apologize for any inconvenience.   Follow-Up: At [facility], you and your health needs are our priority.  As part of our continuing mission to provide you with exceptional heart care, we have created designated Provider Care  Teams.  These Care Teams include your primary Cardiologist (physician) and Advanced Practice Providers (APPs -  Physician Assistants and Nurse Practitioners) who all work together to provide you with the care you need, when you need it.  Your next appointment:   1 year(s)  The format for your next appointment:   In Person  Provider:   Olinda Bertrand, DO{  Other Instructions    1st Floor: - Lobby - Registration  - Pharmacy  - Lab - Cafe  2nd Floor: - PV Lab - Diagnostic Testing (echo, CT, nuclear med)  3rd Floor: - Vacant  4th Floor: - TCTS (cardiothoracic surgery) - AFib Clinic - Structural Heart Clinic - Vascular Surgery  - Vascular Ultrasound  5th Floor: - HeartCare Cardiology (general and EP) - Clinical Pharmacy for coumadin, hypertension, lipid, weight-loss medications, and med management appointments    Valet parking services will be available as well.

## 2024-01-17 ENCOUNTER — Encounter: Payer: Self-pay | Admitting: Cardiology

## 2024-01-31 LAB — COLOGUARD: COLOGUARD: NEGATIVE

## 2024-02-01 ENCOUNTER — Encounter: Payer: Self-pay | Admitting: Family Medicine

## 2024-02-03 ENCOUNTER — Other Ambulatory Visit: Payer: Self-pay | Admitting: Family Medicine

## 2024-02-03 DIAGNOSIS — I10 Essential (primary) hypertension: Secondary | ICD-10-CM

## 2024-02-13 ENCOUNTER — Other Ambulatory Visit: Payer: Self-pay | Admitting: Family Medicine

## 2024-02-13 DIAGNOSIS — I2510 Atherosclerotic heart disease of native coronary artery without angina pectoris: Secondary | ICD-10-CM

## 2024-02-15 ENCOUNTER — Encounter: Payer: Self-pay | Admitting: Family Medicine

## 2024-02-15 DIAGNOSIS — I2510 Atherosclerotic heart disease of native coronary artery without angina pectoris: Secondary | ICD-10-CM

## 2024-02-15 DIAGNOSIS — N952 Postmenopausal atrophic vaginitis: Secondary | ICD-10-CM

## 2024-02-15 MED ORDER — ESTRADIOL 10 MCG VA TABS
ORAL_TABLET | VAGINAL | 11 refills | Status: DC
Start: 2024-02-15 — End: 2024-08-17

## 2024-02-15 MED ORDER — ROSUVASTATIN CALCIUM 20 MG PO TABS: 20.0000 mg | ORAL_TABLET | Freq: Every day | ORAL | 3 refills | Status: DC

## 2024-02-15 NOTE — Addendum Note (Signed)
 Addended by: Gates Kasal C on: 02/15/2024 06:19 PM   Modules accepted: Orders

## 2024-04-22 ENCOUNTER — Other Ambulatory Visit: Payer: Self-pay | Admitting: Family Medicine

## 2024-07-21 ENCOUNTER — Other Ambulatory Visit: Payer: Self-pay | Admitting: Family Medicine

## 2024-07-28 ENCOUNTER — Other Ambulatory Visit: Payer: Self-pay | Admitting: Family Medicine

## 2024-07-28 ENCOUNTER — Other Ambulatory Visit: Payer: Self-pay | Admitting: Cardiology

## 2024-07-28 DIAGNOSIS — I209 Angina pectoris, unspecified: Secondary | ICD-10-CM

## 2024-07-28 DIAGNOSIS — I10 Essential (primary) hypertension: Secondary | ICD-10-CM

## 2024-08-11 ENCOUNTER — Other Ambulatory Visit: Payer: Self-pay

## 2024-08-11 DIAGNOSIS — I2510 Atherosclerotic heart disease of native coronary artery without angina pectoris: Secondary | ICD-10-CM

## 2024-08-11 MED ORDER — ROSUVASTATIN CALCIUM 20 MG PO TABS
20.0000 mg | ORAL_TABLET | Freq: Every day | ORAL | 0 refills | Status: DC
Start: 2024-08-11 — End: 2024-08-17

## 2024-08-17 ENCOUNTER — Encounter: Payer: Self-pay | Admitting: Family Medicine

## 2024-08-17 ENCOUNTER — Encounter: Payer: Self-pay | Admitting: Cardiology

## 2024-08-17 DIAGNOSIS — I2510 Atherosclerotic heart disease of native coronary artery without angina pectoris: Secondary | ICD-10-CM

## 2024-08-17 DIAGNOSIS — I10 Essential (primary) hypertension: Secondary | ICD-10-CM

## 2024-08-17 DIAGNOSIS — I209 Angina pectoris, unspecified: Secondary | ICD-10-CM

## 2024-08-17 DIAGNOSIS — N952 Postmenopausal atrophic vaginitis: Secondary | ICD-10-CM

## 2024-08-17 MED ORDER — ROSUVASTATIN CALCIUM 20 MG PO TABS: 20.0000 mg | ORAL_TABLET | Freq: Every day | ORAL | 3 refills | Status: AC

## 2024-08-17 MED ORDER — ESTRADIOL 10 MCG VA TABS: ORAL_TABLET | VAGINAL | 11 refills | Status: AC

## 2024-08-17 MED ORDER — LOSARTAN POTASSIUM 50 MG PO TABS: 50.0000 mg | ORAL_TABLET | Freq: Every day | ORAL | 3 refills | Status: AC

## 2024-08-18 MED ORDER — METOPROLOL SUCCINATE ER 25 MG PO TB24: 25.0000 mg | ORAL_TABLET | Freq: Every day | ORAL | 1 refills | Status: AC

## 2024-08-18 MED ORDER — EMPAGLIFLOZIN 10 MG PO TABS: 10.0000 mg | ORAL_TABLET | Freq: Every day | ORAL | 1 refills | Status: AC

## 2024-08-25 ENCOUNTER — Other Ambulatory Visit: Payer: Self-pay | Admitting: Family Medicine

## 2024-08-25 DIAGNOSIS — I10 Essential (primary) hypertension: Secondary | ICD-10-CM

## 2024-11-01 ENCOUNTER — Other Ambulatory Visit: Payer: Self-pay

## 2024-12-29 ENCOUNTER — Other Ambulatory Visit (HOSPITAL_COMMUNITY)

## 2025-01-04 ENCOUNTER — Encounter: Admitting: Family Medicine
